# Patient Record
Sex: Female | Born: 1980
Health system: Southern US, Community
[De-identification: ages and names within clinical notes are randomized; demographics above are authoritative.]

## PROBLEM LIST (undated history)

## (undated) ENCOUNTER — Inpatient Hospital Stay (HOSPITAL_COMMUNITY): Payer: Self-pay

## (undated) DIAGNOSIS — D219 Benign neoplasm of connective and other soft tissue, unspecified: Secondary | ICD-10-CM

## (undated) DIAGNOSIS — F418 Other specified anxiety disorders: Secondary | ICD-10-CM

## (undated) DIAGNOSIS — O09529 Supervision of elderly multigravida, unspecified trimester: Secondary | ICD-10-CM

## (undated) DIAGNOSIS — Z20828 Contact with and (suspected) exposure to other viral communicable diseases: Secondary | ICD-10-CM

## (undated) DIAGNOSIS — R002 Palpitations: Secondary | ICD-10-CM

## (undated) DIAGNOSIS — M791 Myalgia, unspecified site: Secondary | ICD-10-CM

## (undated) DIAGNOSIS — I951 Orthostatic hypotension: Secondary | ICD-10-CM

## (undated) DIAGNOSIS — R000 Tachycardia, unspecified: Secondary | ICD-10-CM

## (undated) DIAGNOSIS — G253 Myoclonus: Secondary | ICD-10-CM

## (undated) DIAGNOSIS — R202 Paresthesia of skin: Secondary | ICD-10-CM

## (undated) DIAGNOSIS — I498 Other specified cardiac arrhythmias: Secondary | ICD-10-CM

## (undated) DIAGNOSIS — R200 Anesthesia of skin: Secondary | ICD-10-CM

## (undated) DIAGNOSIS — N809 Endometriosis, unspecified: Secondary | ICD-10-CM

## (undated) DIAGNOSIS — R05 Cough: Secondary | ICD-10-CM

## (undated) DIAGNOSIS — N393 Stress incontinence (female) (male): Secondary | ICD-10-CM

## (undated) DIAGNOSIS — M1990 Unspecified osteoarthritis, unspecified site: Secondary | ICD-10-CM

## (undated) DIAGNOSIS — R1310 Dysphagia, unspecified: Secondary | ICD-10-CM

## (undated) DIAGNOSIS — G90A Postural orthostatic tachycardia syndrome (POTS): Secondary | ICD-10-CM

## (undated) DIAGNOSIS — R252 Cramp and spasm: Secondary | ICD-10-CM

## (undated) DIAGNOSIS — IMO0001 Reserved for inherently not codable concepts without codable children: Secondary | ICD-10-CM

## (undated) HISTORY — DX: Anesthesia of skin: R20.0

## (undated) HISTORY — DX: Supervision of elderly multigravida, unspecified trimester: O09.529

## (undated) HISTORY — DX: Palpitations: R00.2

## (undated) HISTORY — DX: Cough: R05

## (undated) HISTORY — DX: Other specified anxiety disorders: F41.8

## (undated) HISTORY — DX: Benign neoplasm of connective and other soft tissue, unspecified: D21.9

## (undated) HISTORY — DX: Contact with and (suspected) exposure to other viral communicable diseases: Z20.828

## (undated) HISTORY — DX: Myalgia, unspecified site: M79.10

## (undated) HISTORY — DX: Paresthesia of skin: R20.2

## (undated) HISTORY — DX: Unspecified osteoarthritis, unspecified site: M19.90

## (undated) HISTORY — DX: Myoclonus: G25.3

## (undated) HISTORY — DX: Dysphagia, unspecified: R13.10

## (undated) HISTORY — DX: Stress incontinence (female) (male): N39.3

## (undated) HISTORY — DX: Endometriosis, unspecified: N80.9

## (undated) HISTORY — PX: LAPAROSCOPY ABDOMEN DIAGNOSTIC: PRO50

## (undated) HISTORY — DX: Cramp and spasm: R25.2

---

## 2014-02-08 HISTORY — PX: ABDOMINAL EXPLORATION SURGERY: SHX538

## 2014-08-07 ENCOUNTER — Other Ambulatory Visit: Payer: Self-pay | Admitting: Physician Assistant

## 2014-08-07 ENCOUNTER — Ambulatory Visit
Admission: RE | Admit: 2014-08-07 | Discharge: 2014-08-07 | Disposition: A | Discharge status: Home / Self Care | Payer: 59 | Source: Ambulatory Visit | Attending: Physician Assistant | Admitting: Physician Assistant

## 2014-08-07 DIAGNOSIS — R0602 Shortness of breath: Secondary | ICD-10-CM

## 2014-08-13 ENCOUNTER — Other Ambulatory Visit: Payer: Self-pay | Admitting: *Deleted

## 2014-08-13 DIAGNOSIS — R002 Palpitations: Secondary | ICD-10-CM

## 2014-08-13 NOTE — Progress Notes (Signed)
Received referral for event monitor from [facility] at [place], [place]  Fax # [phone]

## 2014-08-19 ENCOUNTER — Encounter (INDEPENDENT_AMBULATORY_CARE_PROVIDER_SITE_OTHER): Payer: 59

## 2014-08-19 ENCOUNTER — Encounter: Payer: Self-pay | Admitting: *Deleted

## 2014-08-19 DIAGNOSIS — R002 Palpitations: Secondary | ICD-10-CM

## 2014-08-19 NOTE — Progress Notes (Signed)
Patient ID: Kathy Dorsey, female   DOB: 07-01-1981, 33 y.o.   MRN: [id number] [employer] 30 day cardiac event monitor applied to patient.

## 2014-09-08 ENCOUNTER — Ambulatory Visit (INDEPENDENT_AMBULATORY_CARE_PROVIDER_SITE_OTHER): Payer: 59 | Admitting: Neurology

## 2014-09-08 ENCOUNTER — Encounter: Payer: Self-pay | Admitting: Neurology

## 2014-09-08 VITALS — BP 100/68 | HR 88 | Ht 64.0 in | Wt 152.3 lb

## 2014-09-08 DIAGNOSIS — R209 Unspecified disturbances of skin sensation: Secondary | ICD-10-CM

## 2014-09-08 DIAGNOSIS — R5383 Other fatigue: Secondary | ICD-10-CM

## 2014-09-08 DIAGNOSIS — R413 Other amnesia: Secondary | ICD-10-CM

## 2014-09-08 DIAGNOSIS — R5381 Other malaise: Secondary | ICD-10-CM

## 2014-09-08 DIAGNOSIS — G43809 Other migraine, not intractable, without status migrainosus: Secondary | ICD-10-CM

## 2014-09-08 DIAGNOSIS — R259 Unspecified abnormal involuntary movements: Secondary | ICD-10-CM

## 2014-09-08 DIAGNOSIS — R253 Fasciculation: Secondary | ICD-10-CM

## 2014-09-08 MED ORDER — NORTRIPTYLINE HCL 10 MG PO CAPS: 10.0000 mg | ORAL_CAPSULE | Freq: Every day | ORAL | Status: DC

## 2014-09-08 NOTE — Progress Notes (Signed)
[facility] Clinic Note - Initial Visit   Date: 09/08/2014  Kathy Dorsey MRN: [id number] DOB: 08-02-1981   Dear Dr. Helene Kelp:  Thank you for your kind referral of Kathy Dorsey for consultation of constellation of symptoms. Although her history is well known to you, please allow [place] to reiterate it for the purpose of our medical record. The patient was accompanied to the clinic by self.    History of Present Illness: Kathy Dorsey is a 33 y.o. right-handed Caucasian female with history of endometriosis s/p surgery presenting for evaluation of generalized muscle twitches, paresthesias, fatigue, and headches.    Starting in January 2015, she was sitting in a recliner and developed left hand and finger jerking which occurred intermittently and gradually started involving her entire body, including abdomen and chest. In March, she started dropping things, for instance, she was at a drive-thru and dropped the the cup.  She also complains of shortness of breath especially when laying flat.  She sleeps on 1-2 pillows.   Her biggest problem is generalized fatigue that started in April.  She feels tired even after doing one load of laundry. Denies double vision or ptosis.  She complains difficulty swallowing especially with liquids.    She also complains of memory problems for a number of years.  She writes feels that word-findings problems.    She has feelings of depression, but attributes this to her weakness.  She is able to take a 2-mile walk daily with her two sons, but finds it more difficult.  No muscle tenderness.   She complaints of generalized sharp stabbing pain over the hands.  She endorses headache, occurring over the right temporal region and described as a sharp pulsating pain. Duration is all day, frequency 3 times per week.  She takes exedrin migraine about 2-3 times per week.  She denies photophobia, phonophobia, nausea/vomiting.   She has been seeing Dr. Berdine Addison,  neurologist, since March who started her on mestinon 60mg  daily.  She developed cramping on the medication so stopped it.  She underwent NCS/EMG which showed bilateral CTS, MRI brain, and MRI cervical spine, and CSF testing which has been nondiagnotic, but we do not have these records to review.   She had had extenisve serology testing which has been normal.  Out-side paper records, electronic medical record, and images have been reviewed where available and summarized as:  Labs 02/2014:  CBC, CMP, TSH 4.05, ANA neg, Lyme neg  Past Medical History  Diagnosis Date  . Arthritis   . SOB (shortness of breath)   . Dysphagia     No past surgical history on file.   Medications:  No current outpatient prescriptions on file prior to visit.   No current facility-administered medications on file prior to visit.    Allergies: Allergies not on file  Family History: Family History  Problem Relation Age of Onset  . Rheum arthritis    . Diabetes Mother   . Cancer Maternal Grandmother   . Diabetes Maternal Grandmother   . Diabetes Paternal Grandfather   . Cancer Paternal Grandfather   . Epilepsy Sister   . Heart Problems Sister     Social History: History   Social History  . Marital Status: Married    Spouse Name: N/A    Number of Children: N/A  . Years of Education: N/A   Occupational History  . Not on file.   Social History Main Topics  . Smoking status: Not on file  .  Smokeless tobacco: Not on file  . Alcohol Use: Not on file  . Drug Use: Not on file  . Sexual Activity: Not on file   Other Topics Concern  . Not on file   Social History Narrative   Lives with husband and 2 sons.  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    Review of Systems:  CONSTITUTIONAL: No fevers, chills, night sweats, or weight loss.   EYES: No visual changes or eye pain ENT: No hearing changes.  No history of nose bleeds.   RESPIRATORY: No cough, wheezing +shortness of breath.   CARDIOVASCULAR:  Negative for chest pain, and palpitations.   GI: +for abdominal discomfort, blood in stools or black stools.  No recent change in bowel habits.   GU:  No history of incontinence.   MUSCLOSKELETAL: +history of joint pain or swelling.  +myalgias.   SKIN: Negative for lesions, rash, and itching.   HEMATOLOGY/ONCOLOGY: Negative for prolonged bleeding, bruising easily, and swollen nodes.  No history of cancer.   ENDOCRINE: Negative for cold or heat intolerance, polydipsia or goiter.   PSYCH:  +depression or anxiety symptoms.   NEURO: As Above.   Vital Signs:  BP 100/68  Pulse 88  Ht 5\' 4"  (1.626 m)  Wt 152 lb 5 oz (69.088 kg)  BMI 26.13 kg/m2  SpO2 98%   General Medical Exam:   General:  Well appearing, comfortable.   Eyes/ENT: see cranial nerve examination.   Neck: No masses appreciated.  Full range of motion without tenderness.  No carotid bruits. Respiratory:  Clear to auscultation, good air entry bilaterally.   Cardiac:  Regular rate and rhythm, no murmur.   Extremities:  No deformities, edema, or skin discoloration. Good capillary refill.   Skin:  Skin color, texture, turgor normal. No rashes or lesions.  Neurological Exam: Montreal Cognitive Assessment  09/08/2014  Visuospatial/ Executive (0/5) 5  Naming (0/3) 3  Attention: Read list of digits (0/2) 2  Attention: Read list of letters (0/1) 1  Attention: Serial 7 subtraction starting at 100 (0/3) 3  Language: Repeat phrase (0/2) 2  Language : Fluency (0/1) 1  Abstraction (0/2) 2  Delayed Recall (0/5) 3  Orientation (0/6) 6  Total 28  Adjusted Score (based on education) 28    MENTAL STATUS including orientation to time, place, person, recent and remote memory, attention span and concentration, language, and fund of knowledge is normal.  Speech is not dysarthric.  CRANIAL NERVES: II:  No visual field defects.  Unremarkable fundi.   III-IV-VI: Pupils equal round and reactive to light.  Normal conjugate, extra-ocular eye  movements in all directions of gaze.  No nystagmus.  No ptosis.   V:  Normal facial sensation.  Jaw jerk is absent.   VII:  Normal facial symmetry and movements.  No pathologic facial reflexes.  VIII:  Normal hearing and vestibular function.   IX-X:  Normal palatal movement.   XI:  Normal shoulder shrug and head rotation.   XII:  Normal tongue strength and range of motion, no deviation or fasciculation.  MOTOR:  No atrophy, fasciculations or abnormal movements.  No pronator drift.  Tone is normal.    Right Upper Extremity:    Left Upper Extremity:    Deltoid  5/5   Deltoid  5/5   Biceps  5/5   Biceps  5/5   Triceps  5/5   Triceps  5/5   Wrist extensors  5/5   Wrist extensors  5/5  Wrist flexors  5/5   Wrist flexors  5/5   Finger extensors  5/5   Finger extensors  5/5   Finger flexors  5/5   Finger flexors  5/5   Dorsal interossei  5/5   Dorsal interossei  5/5   Abductor pollicis  5/5   Abductor pollicis  5/5   Tone (Ashworth scale)  0  Tone (Ashworth scale)  0   Right Lower Extremity:    Left Lower Extremity:    Hip flexors  5/5   Hip flexors  5/5   Hip extensors  5/5   Hip extensors  5/5   Knee flexors  5/5   Knee flexors  5/5   Knee extensors  5/5   Knee extensors  5/5   Dorsiflexors  5/5   Dorsiflexors  5/5   Plantarflexors  5/5   Plantarflexors  5/5   Toe extensors  5/5   Toe extensors  5/5   Toe flexors  5/5   Toe flexors  5/5   Tone (Ashworth scale)  0  Tone (Ashworth scale)  0   MSRs:  Right                                                                 Left brachioradialis 3+  brachioradialis 3+  biceps 3+  biceps 3+  triceps 3+  triceps 3+  patellar 3+  patellar 3+  ankle jerk 2+  ankle jerk 2+  Hoffman no  Hoffman no  plantar response down  plantar response down  No crossed adductors.  0-1 beat ankle clonus bilaterally.  SENSORY:  Normal and symmetric perception of light touch, pinprick, vibration, and proprioception.  Romberg's sign absent.    COORDINATION/GAIT: Normal finger-to- nose-finger and heel-to-shin.  Intact rapid alternating movements bilaterally.  Able to rise from a chair without using arms.  Gait narrow based and stable. Tandem and stressed gait intact.    IMPRESSION: Mrs. Leamy is a 33 year-old female presenting for second opinion regarding a constellation of symptoms including paresthesias, muscle twitches, generalized pain, memory changes, shortness of breath, and dysphagia.  Her neurological exam is notable for brisk and symmetric reflexes throughout, which may be normal for patient since there is no associated upper motor neuron findings.  Additionally, she had had extensive work-up including MRI brain, MRI cervical spine, CSF testing, and EMG which has been nondiagnostic.  At some point, myasthenia has been questioned based on mildly positive tensilon test but there was no improvement with trial of mestinon.   I had a lengthy discussion that with normal testing to-date, a worrisome neurodegenerative condition is unlikely, including multiple sclerosis.  I have requested that her previous records are forwarded to my office so I can personally review the labs that she has been tested for.  In the meantime, I explained that migraine variant can present with diverse manifestations and I can offer treatment for possible migraine equivalent syndrome, which she agrees with.     PLAN/RECOMMENDATIONS:  1.  Records requested from previous neurologist's office to determine if any additional labs are needed 2.  Start nortriptyline 10mg  at bedtime for two weeks, if no improvement, increase to 2 tablets bedtime.  3.  Return to clinic in 2 months.   The duration of  this appointment visit was 60 minutes of face-to-face time with the patient.  Greater than 50% of this time was spent in counseling, explanation of diagnosis, planning of further management, and coordination of care.   Thank you for allowing me to participate in  patient's care.  If I can answer any additional questions, I would be pleased to do so.    Sincerely,    Bonner Larue K. Posey Pronto, DO

## 2014-09-09 NOTE — Progress Notes (Signed)
Note faxed.

## 2014-09-14 NOTE — Progress Notes (Signed)
Addendum  Records received from [facility]  EMG 03/05/2014: Tibial and peroneal motor responses are normal. Bilateral sural responses are normal. EMG 04/02/2014: Right greater than left carpal tunnel syndrome. No evidence of cervical radiculopathy. MRI brain with and without contrast 4/for/20/15: Normal pre-and postcontrast MRI of the brain. MRI cervical spine without contrast 04/07/2014: 1. posterior disc osteophyte complexes causing mild to moderate narrowing of the spinal canal at C5-C6 and mild narrowing at C6-7.  No abnormal cord signal. 2. Mild narrowing of bilateral neural foramen at C5-C6  Labs 03/06/2014: SPEP/UPEP with IFE no M protein, ESR 7, TSH 2.1, T4-9 0.1, T3 31.5, vitamin B12 555, folate greater than 20, hemoglobin A1c 5.7, ANA negative, copper 80, ENA negative, zinc 61, vitamin E 8.6, vitamin B6 15, Lyme negative, acetylcholine receptor binding, blocking, modulating negative, CK 55  CSF 04/17/2014:  G58 P31  Neg/normal: EBV, VDRL, CMV, HSV Cytology negative for malignant cells, no oligoclonal bands, myelin basic protein <2.0, IgG 2.5  Extensive workup has been reviewed.  No additional testing necessary at this time, will continue to follow clinically.  Jaslin Novitski K. Posey Pronto, DO

## 2014-09-15 NOTE — Progress Notes (Signed)
Patient notified

## 2014-10-12 ENCOUNTER — Other Ambulatory Visit (HOSPITAL_COMMUNITY): Payer: Self-pay | Admitting: Physician Assistant

## 2014-10-12 ENCOUNTER — Ambulatory Visit (HOSPITAL_COMMUNITY): Payer: 59 | Attending: Cardiovascular Disease | Admitting: Radiology

## 2014-10-12 DIAGNOSIS — R0602 Shortness of breath: Secondary | ICD-10-CM

## 2014-10-12 DIAGNOSIS — R002 Palpitations: Secondary | ICD-10-CM

## 2014-10-12 NOTE — Progress Notes (Signed)
Echocardiogram performed.  

## 2014-11-09 ENCOUNTER — Ambulatory Visit: Payer: 59 | Admitting: Neurology

## 2014-11-10 ENCOUNTER — Encounter: Payer: Self-pay | Admitting: *Deleted

## 2014-11-10 ENCOUNTER — Telehealth: Payer: Self-pay | Admitting: Neurology

## 2014-11-10 NOTE — Progress Notes (Signed)
No show letter sent for 11/09/2014

## 2014-11-10 NOTE — Telephone Encounter (Signed)
Pt no showed 11/09/14 follow up appt w/ Dr. Posey Pronto.  Danae Chen - please send no show letter / Gayleen [place]rem.

## 2015-02-05 DIAGNOSIS — I498 Other specified cardiac arrhythmias: Secondary | ICD-10-CM | POA: Insufficient documentation

## 2015-02-05 DIAGNOSIS — G90A Postural orthostatic tachycardia syndrome (POTS): Secondary | ICD-10-CM | POA: Insufficient documentation

## 2015-02-05 DIAGNOSIS — I951 Orthostatic hypotension: Secondary | ICD-10-CM

## 2015-02-05 DIAGNOSIS — R000 Tachycardia, unspecified: Secondary | ICD-10-CM

## 2015-04-04 ENCOUNTER — Inpatient Hospital Stay (HOSPITAL_COMMUNITY)
Admission: AD | Admit: 2015-04-04 | Payer: Commercial Managed Care - PPO | Source: Ambulatory Visit | Admitting: Obstetrics and Gynecology

## 2015-04-15 DIAGNOSIS — R200 Anesthesia of skin: Secondary | ICD-10-CM

## 2015-04-15 DIAGNOSIS — R202 Paresthesia of skin: Secondary | ICD-10-CM

## 2015-04-15 DIAGNOSIS — R252 Cramp and spasm: Secondary | ICD-10-CM

## 2015-04-15 DIAGNOSIS — M791 Myalgia, unspecified site: Secondary | ICD-10-CM

## 2015-04-15 HISTORY — DX: Myalgia, unspecified site: M79.10

## 2015-04-15 HISTORY — DX: Anesthesia of skin: R20.0

## 2015-04-15 HISTORY — DX: Paresthesia of skin: R20.2

## 2015-04-15 HISTORY — DX: Cramp and spasm: R25.2

## 2015-08-17 ENCOUNTER — Inpatient Hospital Stay (HOSPITAL_COMMUNITY)
Admission: AD | Admit: 2015-08-17 | Discharge: 2015-08-17 | Disposition: A | Discharge status: Home / Self Care | Payer: Commercial Managed Care - PPO | Source: Ambulatory Visit | Attending: Obstetrics and Gynecology | Admitting: Obstetrics and Gynecology

## 2015-08-17 ENCOUNTER — Inpatient Hospital Stay (HOSPITAL_COMMUNITY): Payer: Commercial Managed Care - PPO

## 2015-08-17 ENCOUNTER — Encounter (HOSPITAL_COMMUNITY): Payer: Self-pay | Admitting: *Deleted

## 2015-08-17 DIAGNOSIS — O209 Hemorrhage in early pregnancy, unspecified: Secondary | ICD-10-CM | POA: Insufficient documentation

## 2015-08-17 DIAGNOSIS — O468X1 Other antepartum hemorrhage, first trimester: Secondary | ICD-10-CM | POA: Diagnosis not present

## 2015-08-17 DIAGNOSIS — Z3A01 Less than 8 weeks gestation of pregnancy: Secondary | ICD-10-CM | POA: Diagnosis not present

## 2015-08-17 DIAGNOSIS — O418X10 Other specified disorders of amniotic fluid and membranes, first trimester, not applicable or unspecified: Secondary | ICD-10-CM

## 2015-08-17 LAB — URINALYSIS, ROUTINE W REFLEX MICROSCOPIC
Bilirubin Urine: NEGATIVE
Glucose, UA: NEGATIVE mg/dL
KETONES UR: NEGATIVE mg/dL
NITRITE: NEGATIVE
PROTEIN: NEGATIVE mg/dL
Specific Gravity, Urine: 1.01 (ref 1.005–1.030)
UROBILINOGEN UA: 0.2 mg/dL (ref 0.0–1.0)
pH: 5.5 (ref 5.0–8.0)

## 2015-08-17 LAB — WET PREP, GENITAL
CLUE CELLS WET PREP: NONE SEEN
Trich, Wet Prep: NONE SEEN
YEAST WET PREP: NONE SEEN

## 2015-08-17 LAB — ABO/RH: ABO/RH(D): O POS

## 2015-08-17 LAB — CBC
HCT: 36.5 % (ref 36.0–46.0)
HEMOGLOBIN: 12.4 g/dL (ref 12.0–15.0)
MCH: 29.5 pg (ref 26.0–34.0)
MCHC: 34 g/dL (ref 30.0–36.0)
MCV: 86.7 fL (ref 78.0–100.0)
PLATELETS: 250 10*3/uL (ref 150–400)
RBC: 4.21 MIL/uL (ref 3.87–5.11)
RDW: 14 % (ref 11.5–15.5)
WBC: 9.2 10*3/uL (ref 4.0–10.5)

## 2015-08-17 LAB — URINE MICROSCOPIC-ADD ON

## 2015-08-17 LAB — HCG, QUANTITATIVE, PREGNANCY: hCG, Beta Chain, Quant, S: 46850 m[IU]/mL — ABNORMAL HIGH (ref <5)

## 2015-08-17 NOTE — MAU Note (Signed)
Pt C/O spotting since yesterday, has continued to have light pink/brown spotting today, also lower abd cramping & back pain.

## 2015-08-17 NOTE — MAU Provider Note (Signed)
Chief Complaint: Vaginal Bleeding; Abdominal Pain; and Back Pain   First Provider Initiated Contact with Patient 08/17/15 1603     SUBJECTIVE HPI: Kathy Dorsey is a 34 y.o. [id number] at [redacted]w[redacted]d by LMP who presents [facility] reporting spotting and low abd/low back cramping since yesterday.   Location: mid low abd, low back Quality: cramping Severity: 5/10 on pain scale Duration: 24 hour Context: none Timing: constant Modifying factors: None. Hasn't tried anything to Tx pain.  Associated signs and symptoms: Neg for fever, chills, urinary complaints, GI, complaints, passage of clots or tissue.  Has NOB appt scheduled at [facility].   Past Medical History  Diagnosis Date  . Arthritis   . SOB (shortness of breath)   . Dysphagia    OB History  Gravida Para Term Preterm AB SAB TAB Ectopic Multiple Living  5 2 2  2 2   0 0 2    # Outcome Date GA Lbr Len/2nd Weight Sex Delivery Anes PTL Lv  5 Current           4 Term 07/08/07    [address] EPI  Y  3 Term 12/09/04    M Vag-Spont EPI  Y  2 SAB           1 SAB              Past Surgical History  Procedure Laterality Date  . Abdominal exploration surgery     Social History   Social History  . Marital Status: Married    Spouse Name: N/A  . Number of Children: N/A  . Years of Education: N/A   Occupational History  . Not on file.   Social History Main Topics  . Smoking status: Never Smoker   . Smokeless tobacco: Never Used  . Alcohol Use: No  . Drug Use: No  . Sexual Activity: Yes   Other Topics Concern  . Not on file   Social History Narrative   Lives with husband and 2 sons.  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   No current facility-administered medications on file prior to encounter.   No current outpatient prescriptions on file prior to encounter.   Allergies  Allergen Reactions  . Prednisolone Palpitations and Other (See Comments)    Joint pain    I have reviewed the past Medical Hx,  Surgical Hx, Social Hx, Allergies and Medications.   Review of Systems  Constitutional: Negative for fever and chills.  Gastrointestinal: Positive for abdominal pain. Negative for nausea, vomiting, diarrhea and constipation.  Genitourinary: Positive for vaginal bleeding. Negative for dysuria, urgency, frequency, hematuria, flank pain and vaginal discharge.  Musculoskeletal: Negative for back pain.  Neurological: Negative for dizziness.    OBJECTIVE Patient Vitals for the past 24 hrs:  BP Temp Temp src Pulse Resp SpO2 Height Weight  08/17/15 1536 - 98.2 F (36.8 C) - - - - - -  08/17/15 1530 114/63 mmHg - - 93 - - - -  08/17/15 1251 120/80 mmHg 98.6 F (37 C) Oral 85 18 100 % 5\' 4"  (1.626 m) 158 lb (71.668 kg)   Constitutional: Well-developed, well-nourished female in no acute distress.  Cardiovascular: normal rate Respiratory: normal rate and effort.  GI: Abd soft, non-tender, fundus non-palpable Pos BS x 4 MS: Extremities nontender, no edema, normal ROM Neurologic: Alert and oriented x 4.  GU: Neg CVAT.  SPECULUM EXAM: NEFG, scant dark red blood noted, cervix clean  BIMANUAL: cervix closed; uterus slightly  enlarged, no adnexal tenderness or masses. No CMT.  LAB RESULTS Results for orders placed or performed during the hospital encounter of 08/17/15 (from the past 24 hour(s))  Urinalysis, Routine w reflex microscopic (not at [facility])     Status: Abnormal   Collection Time: 08/17/15 12:55 PM  Result Value Ref Range   Color, Urine YELLOW YELLOW   APPearance CLEAR CLEAR   Specific Gravity, Urine 1.010 1.005 - 1.030   pH 5.5 5.0 - 8.0   Glucose, UA NEGATIVE NEGATIVE mg/dL   Hgb urine dipstick SMALL (A) NEGATIVE   Bilirubin Urine NEGATIVE NEGATIVE   Ketones, ur NEGATIVE NEGATIVE mg/dL   Protein, ur NEGATIVE NEGATIVE mg/dL   Urobilinogen, UA 0.2 0.0 - 1.0 mg/dL   Nitrite NEGATIVE NEGATIVE   Leukocytes, UA SMALL (A) NEGATIVE  Urine microscopic-add on     Status: Abnormal    Collection Time: 08/17/15 12:55 PM  Result Value Ref Range   Squamous Epithelial / LPF FEW (A) RARE   WBC, UA 3-6 <3 WBC/hpf   RBC / HPF 0-2 <3 RBC/hpf  CBC     Status: None   Collection Time: 08/17/15  5:16 PM  Result Value Ref Range   WBC 9.2 4.0 - 10.5 K/uL   RBC 4.21 3.87 - 5.11 MIL/uL   Hemoglobin 12.4 12.0 - 15.0 g/dL   HCT 36.5 36.0 - 46.0 %   MCV 86.7 78.0 - 100.0 fL   MCH 29.5 26.0 - 34.0 pg   MCHC 34.0 30.0 - 36.0 g/dL   RDW 14.0 11.5 - 15.5 %   Platelets 250 150 - 400 K/uL  ABO/Rh     Status: None (Preliminary result)   Collection Time: 08/17/15  5:16 PM  Result Value Ref Range   ABO/RH(D) O POS   Wet prep, genital     Status: Abnormal   Collection Time: 08/17/15  6:00 PM  Result Value Ref Range   Yeast Wet Prep HPF POC NONE SEEN NONE SEEN   Trich, Wet Prep NONE SEEN NONE SEEN   Clue Cells Wet Prep HPF POC NONE SEEN NONE SEEN   WBC, Wet Prep HPF POC MODERATE (A) NONE SEEN   Quant: 46,850  IMAGING US Ob Comp Less 14 Wks  08/17/2015   CLINICAL DATA:  Vaginal bleeding  EXAM: OBSTETRIC <14 WK [place] AND TRANSVAGINAL OB US  TECHNIQUE: Both transabdominal and transvaginal ultrasound examinations were performed for complete evaluation of the gestation as well as the maternal uterus, adnexal regions, and pelvic cul-de-sac. Transvaginal technique was performed to assess early pregnancy.  COMPARISON:  None.  FINDINGS: Intrauterine gestational sac: Visualized/normal in shape.  Yolk sac:  Present  Embryo:  Present  Cardiac Activity: Present  Heart Rate: 133  bpm  CRL:  7.6  mm   6 w   5 d                  [place] EDC: 04/06/2016  Maternal uterus/adnexae: Small subchorionic hemorrhage is noted. The ovaries appear within normal limits. The uterus is otherwise unremarkable.  IMPRESSION: Single live intrauterine gestation at 6 weeks 5 days. A small subchorionic hemorrhage is noted. Followup examination can be performed as clinically indicated.   Electronically Signed   By: Inez Catalina M.D.   On:  08/17/2015 17:09   US Ob Transvaginal  08/17/2015   CLINICAL DATA:  Vaginal bleeding  EXAM: OBSTETRIC <14 WK [place] AND TRANSVAGINAL OB US  TECHNIQUE: Both transabdominal and transvaginal ultrasound examinations were performed for  complete evaluation of the gestation as well as the maternal uterus, adnexal regions, and pelvic cul-de-sac. Transvaginal technique was performed to assess early pregnancy.  COMPARISON:  None.  FINDINGS: Intrauterine gestational sac: Visualized/normal in shape.  Yolk sac:  Present  Embryo:  Present  Cardiac Activity: Present  Heart Rate: 133  bpm  CRL:  7.6  mm   6 w   5 d                  [place] EDC: 04/06/2016  Maternal uterus/adnexae: Small subchorionic hemorrhage is noted. The ovaries appear within normal limits. The uterus is otherwise unremarkable.  IMPRESSION: Single live intrauterine gestation at 6 weeks 5 days. A small subchorionic hemorrhage is noted. Followup examination can be performed as clinically indicated.   Electronically Signed   By: Inez Catalina M.D.   On: 08/17/2015 17:09    MAU COURSE UA, UPT, CBC, Quant, Wet prep, GC/Chlamydia, [place]  MDM 34 year-old pregnant female presents w/ spotting and cramping evaluated for ectopic, SAB, infection. Live, SIUP w/ small Keeler seen on [place] today. No evidence of emergent condition.  ASSESSMENT 1. Subchorionic hemorrhage, first trimester   2. Vaginal bleeding in pregnancy, first trimester     PLAN Discharge home in stable condition. First trimester Precautions. Pelvic rest x 1 week. GC/Chlamydia, urine cultures pending.  Follow-up Information    Follow up with Logan Bores, MD On 09/03/2015.   Specialty:  Obstetrics and Gynecology   Why:  Routine prenatal visit or sooner As needed if symptoms worsen   Contact information:   [address] [address] [phone]       Follow up with [facility].   Why:  As needed in emergencies   Contact  information:   [address] [id number] [place] [phone]       Medication List    TAKE these medications        PRENATAL DHA PO  Take 1 tablet by mouth daily.       [place], CNM 08/17/2015  6:18 PM

## 2015-08-17 NOTE — Discharge Instructions (Signed)
Subchorionic Hematoma °A subchorionic hematoma is a gathering of blood between the outer wall of the placenta and the inner wall of the womb (uterus). The placenta is the organ that connects the fetus to the wall of the uterus. The placenta performs the feeding, breathing (oxygen to the fetus), and waste removal (excretory work) of the fetus.  °Subchorionic hematoma is the most common abnormality found on a result from ultrasonography done during the first trimester or early second trimester of pregnancy. If there has been little or no vaginal bleeding, early small hematomas usually shrink on their own and do not affect your baby or pregnancy. The blood is gradually absorbed over 1-2 weeks. When bleeding starts later in pregnancy or the hematoma is larger or occurs in an older pregnant woman, the outcome may not be as good. Larger hematomas may get bigger, which increases the chances for miscarriage. Subchorionic hematoma also increases the risk of premature detachment of the placenta from the uterus, preterm (premature) labor, and stillbirth. °HOME CARE INSTRUCTIONS °· Stay on bed rest if your health care provider recommends this. Although bed rest will not prevent more bleeding or prevent a miscarriage, your health care provider may recommend bed rest until you are advised otherwise. °· Avoid heavy lifting (more than 10 lb [4.5 kg]), exercise, sexual intercourse, or douching as directed by your health care provider. °· Keep track of the number of pads you use each day and how soaked (saturated) they are. Write down this information. °· Do not use tampons. °· Keep all follow-up appointments as directed by your health care provider. Your health care provider may ask you to have follow-up blood tests or ultrasound tests or both. °SEEK IMMEDIATE MEDICAL CARE IF: °· You have severe cramps in your stomach, back, abdomen, or pelvis. °· You have a fever. °· You pass large clots or tissue. Save any tissue for your health  care provider to look at. °· Your bleeding increases or you become lightheaded, feel weak, or have fainting episodes. °Document Released: 03/14/2007 Document Revised: 04/13/2014 Document Reviewed: 06/26/2013 °ExitCare® Patient Information ©2015 [employer]. This information is not intended to replace advice given to you by your health care provider. Make sure you discuss any questions you have with your health care provider. ° °Pelvic Rest °Pelvic rest is sometimes recommended for women when:  °· The placenta is partially or completely covering the opening of the cervix (placenta previa). °· There is bleeding between the uterine wall and the amniotic sac in the first trimester (subchorionic hemorrhage). °· The cervix begins to open without labor starting (incompetent cervix, cervical insufficiency). °· The labor is too early (preterm labor). °HOME CARE INSTRUCTIONS °· Do not have sexual intercourse, stimulation, or an orgasm. °· Do not use tampons, douche, or put anything in the vagina. °· Do not lift anything over 10 pounds (4.5 kg). °· Avoid strenuous activity or straining your pelvic muscles. °SEEK MEDICAL CARE IF:  °· You have any vaginal bleeding during pregnancy. Treat this as a potential emergency. °· You have cramping pain felt low in the stomach (stronger than menstrual cramps). °· You notice vaginal discharge (watery, mucus, or bloody). °· You have a low, dull backache. °· There are regular contractions or uterine tightening. °SEEK IMMEDIATE MEDICAL CARE IF: °You have vaginal bleeding and have placenta previa.  °Document Released: 03/24/2011 Document Revised: 02/19/2012 Document Reviewed: 03/24/2011 °ExitCare® Patient Information ©2015 [employer]. This information is not intended to replace advice given to you by your health care provider.   provider. Make sure you discuss any questions you have with your health care provider.

## 2015-08-18 LAB — CULTURE, OB URINE: Special Requests: NORMAL

## 2015-08-18 LAB — GC/CHLAMYDIA PROBE AMP (~~LOC~~) NOT AT ARMC
Chlamydia: NEGATIVE
NEISSERIA GONORRHEA: NEGATIVE

## 2015-08-18 LAB — HIV ANTIBODY (ROUTINE TESTING W REFLEX): HIV SCREEN 4TH GENERATION: NONREACTIVE

## 2015-09-03 LAB — OB RESULTS CONSOLE ABO/RH: RH Type: POSITIVE

## 2015-09-03 LAB — OB RESULTS CONSOLE HEPATITIS B SURFACE ANTIGEN: HEP B S AG: NEGATIVE

## 2015-09-03 LAB — OB RESULTS CONSOLE RUBELLA ANTIBODY, IGM: Rubella: IMMUNE

## 2015-09-03 LAB — OB RESULTS CONSOLE RPR: RPR: NONREACTIVE

## 2015-09-03 LAB — OB RESULTS CONSOLE GC/CHLAMYDIA: Gonorrhea: NEGATIVE

## 2015-12-12 NOTE — L&D Delivery Note (Signed)
Delivery Note At 3:14 PM a viable and healthy female was delivered via Vaginal, Spontaneous Delivery (Presentation: Right Occiput Anterior).  APGAR: 7, 8; weight 6 lb 15.1 oz (3150 g).   Placenta status: Intact, Spontaneous.  Cord: 3 vessels with the following complications: None.   Anesthesia: Epidural  Episiotomy: None Lacerations: 1st degree;Labial;Vaginal - hemostatic Suture Repair: N/A Est. Blood Loss (mL): 150cc  Mom to postpartum.  Baby to Couplet care / Skin to Skin.  Bovard-Stuckert, Kathy Dorsey 03/05/2016, 4:54 PM  O+/Br/RI/no Tdap in PNC/Contra?  D/W pt and FOB circumcision for female infant inc r/b/a wishes to proceed.

## 2016-01-20 ENCOUNTER — Inpatient Hospital Stay (HOSPITAL_COMMUNITY)
Admission: AD | Admit: 2016-01-20 | Discharge: 2016-01-21 | Disposition: A | Discharge status: Home / Self Care | Payer: Commercial Managed Care - PPO | Source: Ambulatory Visit | Attending: Obstetrics and Gynecology | Admitting: Obstetrics and Gynecology

## 2016-01-20 ENCOUNTER — Encounter (HOSPITAL_COMMUNITY): Payer: Self-pay | Admitting: *Deleted

## 2016-01-20 DIAGNOSIS — O4700 False labor before 37 completed weeks of gestation, unspecified trimester: Secondary | ICD-10-CM | POA: Insufficient documentation

## 2016-01-20 DIAGNOSIS — Z820 Family history of epilepsy and other diseases of the nervous system: Secondary | ICD-10-CM | POA: Diagnosis not present

## 2016-01-20 DIAGNOSIS — Z809 Family history of malignant neoplasm, unspecified: Secondary | ICD-10-CM | POA: Diagnosis not present

## 2016-01-20 DIAGNOSIS — Z3A29 29 weeks gestation of pregnancy: Secondary | ICD-10-CM | POA: Diagnosis present

## 2016-01-20 DIAGNOSIS — Z833 Family history of diabetes mellitus: Secondary | ICD-10-CM | POA: Diagnosis not present

## 2016-01-20 DIAGNOSIS — O4703 False labor before 37 completed weeks of gestation, third trimester: Secondary | ICD-10-CM

## 2016-01-20 LAB — URINALYSIS, ROUTINE W REFLEX MICROSCOPIC
BILIRUBIN URINE: NEGATIVE
Glucose, UA: NEGATIVE mg/dL
Hgb urine dipstick: NEGATIVE
KETONES UR: 40 mg/dL — AB
NITRITE: NEGATIVE
PROTEIN: NEGATIVE mg/dL
SPECIFIC GRAVITY, URINE: 1.01 (ref 1.005–1.030)
pH: 6 (ref 5.0–8.0)

## 2016-01-20 LAB — WET PREP, GENITAL
Clue Cells Wet Prep HPF POC: NONE SEEN
SPERM: NONE SEEN
TRICH WET PREP: NONE SEEN
YEAST WET PREP: NONE SEEN

## 2016-01-20 LAB — URINE MICROSCOPIC-ADD ON

## 2016-01-20 MED ORDER — NIFEDIPINE 10 MG PO CAPS
10.0000 mg | ORAL_CAPSULE | ORAL | Status: AC
  Administered 2016-01-20 – 2016-01-21 (×3): 10 mg via ORAL
  Filled 2016-01-20: qty 1

## 2016-01-20 NOTE — MAU Provider Note (Signed)
History     CSN: [id number]  Arrival date and time: 01/20/16 2225   First Provider Initiated Contact with Patient 01/20/16 2300      Chief Complaint  Patient presents with  . Contractions    8ctx per hr for a few hours   HPI Comments: Kathy Dorsey is a 35 y.o. [id number] at [redacted]w[redacted]d who presents today with contractions. She states that the contractions started about 3 hours ago, and are about every 8-11 mins. She denies any VB or LOF. She confirms fetal movement. She states that she was seen in the office about 2 weeks ago for contractions. She had a negative FFN, and she states that her cervix was FT/thick. She has had 2 full term deliveries, and reports that she had preterm contractions with her first child. However, he was born at term. She also reports a hx of POTS, and states that she has had increased episodes of recently. She last saw cardiology about 6-8 weeks ago. She is not currently on medication at this time.   Abdominal Pain This is a new problem. The current episode started today. The onset quality is gradual. The problem occurs intermittently (about 8-11 per hour ). The problem has been unchanged. The pain is located in the suprapubic region. The pain is at a severity of 2/10. The quality of the pain is cramping. The abdominal pain does not radiate. Pertinent negatives include no constipation, diarrhea, dysuria, fever, frequency, nausea or vomiting. Nothing aggravates the pain. The pain is relieved by nothing. She has tried nothing for the symptoms.     Past Medical History  Diagnosis Date  . Arthritis   . SOB (shortness of breath)   . Dysphagia     Past Surgical History  Procedure Laterality Date  . Abdominal exploration surgery      Family History  Problem Relation Age of Onset  . Rheum arthritis    . Diabetes Mother   . Cancer Maternal Grandmother   . Diabetes Maternal Grandmother   . Diabetes Paternal Grandfather   . Cancer Paternal Grandfather   . Epilepsy Sister    . Heart Problems Sister     Social History  Substance Use Topics  . Smoking status: Never Smoker   . Smokeless tobacco: Never Used  . Alcohol Use: No    Allergies:  Allergies  Allergen Reactions  . Prednisolone Palpitations and Other (See Comments)    Joint pain    Prescriptions prior to admission  Medication Sig Dispense Refill Last Dose  . Docosahexaenoic Acid (PRENATAL DHA PO) Take 1 tablet by mouth daily.   08/16/2015 at Unknown time    Review of Systems  Constitutional: Negative for fever and chills.  Gastrointestinal: Positive for abdominal pain. Negative for nausea, vomiting, diarrhea and constipation.  Genitourinary: Negative for dysuria, urgency and frequency.  Neurological: Positive for dizziness.   Physical Exam   Blood pressure 108/67, pulse 95, height 5\' 4"  (1.626 m), weight 77.928 kg (171 lb 12.8 oz), last menstrual period 06/28/2015, SpO2 94 %.  Physical Exam  Nursing note and vitals reviewed. Constitutional: She is oriented to person, place, and time. She appears well-developed and well-nourished. No distress.  HENT:  Head: Normocephalic.  Cardiovascular: Normal rate.   Respiratory: Effort normal.  GI: Soft. There is no tenderness. There is no rebound.  Genitourinary:  FT at ext os/closed at int os/thick/-3   Neurological: She is alert and oriented to person, place, and time.  Skin: Skin is warm and dry.  Psychiatric: She has a normal mood and affect.   FHT: 135, moderate with 15x15 accels, no decels Toco: q 4-5 min contractions  MAU Course  Procedures  MDM 2338; D/W Dr. Willis Modena, patient can have terb or procardia to stop contractions. Terb is less likely to drop b/p, and may be preferred. Will offer both to the patient. If ctx stop and no cervical change then patient can be dc home.   D/W the patient at length. She would like to try procardia at this time.   0115: Patient has had 3 doses of procardia. She reports that contractions have  spaced out, and feel less intense.  FHT: 135, moderate with 15x15 accels, no decels Toco: no UCs  Cervical exam: Unchanged   Assessment and Plan   1. Threatened preterm labor, third trimester    DC home Comfort measures reviewed  3rd Trimester precautions  Fetal kick counts RX: none  Return to MAU as needed FU with OB as planned  Follow-up Information    Follow up with Clarene Duke, MD.   Specialty:  Obstetrics and Gynecology   Why:  As scheduled   Contact information:   [address], [address] [place] [address] [phone]         Mathis Bud 01/20/2016, 11:02 PM

## 2016-01-20 NOTE — MAU Note (Addendum)
Pt c/o ctx for 3 hours (8-11/ hr). Pt rates the ctx pain a 2/10 and states she is hydrated and well rested. Denies LOF and VB. +FM. Also c/o SOB and tachycardia (pt has POTS).

## 2016-01-21 DIAGNOSIS — O4703 False labor before 37 completed weeks of gestation, third trimester: Secondary | ICD-10-CM | POA: Diagnosis not present

## 2016-01-21 DIAGNOSIS — O4700 False labor before 37 completed weeks of gestation, unspecified trimester: Secondary | ICD-10-CM | POA: Diagnosis not present

## 2016-01-21 NOTE — Progress Notes (Signed)
FHT from 2-9 reviewed.  Reactive NST, irregular ctx.

## 2016-01-21 NOTE — Discharge Instructions (Signed)

## 2016-02-18 DIAGNOSIS — O09529 Supervision of elderly multigravida, unspecified trimester: Secondary | ICD-10-CM

## 2016-02-18 HISTORY — DX: Supervision of elderly multigravida, unspecified trimester: O09.529

## 2016-02-29 ENCOUNTER — Encounter (HOSPITAL_COMMUNITY): Payer: Self-pay | Admitting: *Deleted

## 2016-02-29 ENCOUNTER — Inpatient Hospital Stay (HOSPITAL_COMMUNITY)
Admission: AD | Admit: 2016-02-29 | Discharge: 2016-02-29 | Disposition: A | Discharge status: Home / Self Care | Payer: Commercial Managed Care - PPO | Source: Ambulatory Visit | Attending: Obstetrics and Gynecology | Admitting: Obstetrics and Gynecology

## 2016-02-29 DIAGNOSIS — Z833 Family history of diabetes mellitus: Secondary | ICD-10-CM | POA: Diagnosis not present

## 2016-02-29 DIAGNOSIS — O09523 Supervision of elderly multigravida, third trimester: Secondary | ICD-10-CM | POA: Insufficient documentation

## 2016-02-29 DIAGNOSIS — O26893 Other specified pregnancy related conditions, third trimester: Secondary | ICD-10-CM | POA: Diagnosis not present

## 2016-02-29 DIAGNOSIS — Z888 Allergy status to other drugs, medicaments and biological substances status: Secondary | ICD-10-CM | POA: Diagnosis not present

## 2016-02-29 DIAGNOSIS — N898 Other specified noninflammatory disorders of vagina: Secondary | ICD-10-CM

## 2016-02-29 DIAGNOSIS — Z3A35 35 weeks gestation of pregnancy: Secondary | ICD-10-CM | POA: Diagnosis not present

## 2016-02-29 DIAGNOSIS — O4703 False labor before 37 completed weeks of gestation, third trimester: Secondary | ICD-10-CM

## 2016-02-29 LAB — URINALYSIS, ROUTINE W REFLEX MICROSCOPIC
BILIRUBIN URINE: NEGATIVE
Glucose, UA: NEGATIVE mg/dL
Ketones, ur: 15 mg/dL — AB
NITRITE: NEGATIVE
PH: 6 (ref 5.0–8.0)
Protein, ur: NEGATIVE mg/dL
SPECIFIC GRAVITY, URINE: 1.025 (ref 1.005–1.030)

## 2016-02-29 LAB — URINE MICROSCOPIC-ADD ON: RBC / HPF: NONE SEEN RBC/hpf (ref 0–5)

## 2016-02-29 LAB — POCT FERN TEST: POCT FERN TEST: NEGATIVE

## 2016-02-29 LAB — AMNISURE RUPTURE OF MEMBRANE (ROM) NOT AT ARMC: AMNISURE: NEGATIVE

## 2016-02-29 NOTE — MAU Note (Signed)
Pt C/O losing her mucus plug a couple of days ago.  Had increased pink discharge last night, began having uc's around 2200.  Woke up @ 0400 with increased pain, uc's are now 5 minutes apart.  Denies bright red bleeding, had ? Gush of fluid this morning.

## 2016-02-29 NOTE — Discharge Instructions (Signed)
Braxton Hicks Contractions °Contractions of the uterus can occur throughout pregnancy. Contractions are not always a sign that you are in labor.  °WHAT ARE BRAXTON HICKS CONTRACTIONS?  °Contractions that occur before labor are called Braxton Hicks contractions, or false labor. Toward the end of pregnancy (32-34 weeks), these contractions can develop more often and may become more forceful. This is not true labor because these contractions do not result in opening (dilatation) and thinning of the cervix. They are sometimes difficult to tell apart from true labor because these contractions can be forceful and people have different pain tolerances. You should not feel embarrassed if you go to the hospital with false labor. Sometimes, the only way to tell if you are in true labor is for your health care provider to look for changes in the cervix. °If there are no prenatal problems or other health problems associated with the pregnancy, it is completely safe to be sent home with false labor and await the onset of true labor. °HOW CAN YOU TELL THE DIFFERENCE BETWEEN TRUE AND FALSE LABOR? °False Labor °· The contractions of false labor are usually shorter and not as hard as those of true labor.   °· The contractions are usually irregular.   °· The contractions are often felt in the front of the lower abdomen and in the groin.   °· The contractions may go away when you walk around or change positions while lying down.   °· The contractions get weaker and are shorter lasting as time goes on.   °· The contractions do not usually become progressively stronger, regular, and closer together as with true labor.   °True Labor °1. Contractions in true labor last 30-70 seconds, become very regular, usually become more intense, and increase in frequency.   °2. The contractions do not go away with walking.   °3. The discomfort is usually felt in the top of the uterus and spreads to the lower abdomen and low back.   °4. True labor can  be determined by your health care provider with an exam. This will show that the cervix is dilating and getting thinner.   °WHAT TO REMEMBER °· Keep up with your usual exercises and follow other instructions given by your health care provider.   °· Take medicines as directed by your health care provider.   °· Keep your regular prenatal appointments.   °· Eat and drink lightly if you think you are going into labor.   °· If Braxton Hicks contractions are making you uncomfortable:   °· Change your position from lying down or resting to walking, or from walking to resting.   °· Sit and rest in a tub of warm water.   °· Drink 2-3 glasses of water. Dehydration may cause these contractions.   °· Do slow and deep breathing several times an hour.   °WHEN SHOULD I SEEK IMMEDIATE MEDICAL CARE? °Seek immediate medical care if: °· Your contractions become stronger, more regular, and closer together.   °· You have fluid leaking or gushing from your vagina.   °· You have a fever.   °· You pass blood-tinged mucus.   °· You have vaginal bleeding.   °· You have continuous abdominal pain.   °· You have low back pain that you never had before.   °· You feel your baby's head pushing down and causing pelvic pressure.   °· Your baby is not moving as much as it used to.   °  °This information is not intended to replace advice given to you by your health care provider. Make sure you discuss any questions you have with your health care   provider. °  °Document Released: 11/27/2005 Document Revised: 12/02/2013 Document Reviewed: 09/08/2013 °Elsevier Interactive Patient Education ©2016 [employer] ° °Fetal Movement Counts °Patient Name: __________________________________________________ Patient Due Date: ____________________ °Performing a fetal movement count is highly recommended in high-risk pregnancies, but it is good for every pregnant woman to do. Your health care provider may ask you to start counting fetal movements at 28 weeks of the  pregnancy. Fetal movements often increase: °· After eating a full meal. °· After physical activity. °· After eating or drinking something sweet or cold. °· At rest. °Pay attention to when you feel the baby is most active. This will help you notice a pattern of your baby's sleep and wake cycles and what factors contribute to an increase in fetal movement. It is important to perform a fetal movement count at the same time each day when your baby is normally most active.  °HOW TO COUNT FETAL MOVEMENTS °5. Find a quiet and comfortable area to sit or lie down on your left side. Lying on your left side provides the best blood and oxygen circulation to your baby. °6. Write down the day and time on a sheet of paper or in a journal. °7. Start counting kicks, flutters, swishes, rolls, or jabs in a 2-hour period. You should feel at least 10 movements within 2 hours. °8. If you do not feel 10 movements in 2 hours, wait 2-3 hours and count again. Look for a change in the pattern or not enough counts in 2 hours. °SEEK MEDICAL CARE IF: °· You feel less than 10 counts in 2 hours, tried twice. °· There is no movement in over an hour. °· The pattern is changing or taking longer each day to reach 10 counts in 2 hours. °· You feel the baby is not moving as he or she usually does. °Date: ____________ Movements: ____________ Start time: ____________ Finish time: ____________  °Date: ____________ Movements: ____________ Start time: ____________ Finish time: ____________ °Date: ____________ Movements: ____________ Start time: ____________ Finish time: ____________ °Date: ____________ Movements: ____________ Start time: ____________ Finish time: ____________ °Date: ____________ Movements: ____________ Start time: ____________ Finish time: ____________ °Date: ____________ Movements: ____________ Start time: ____________ Finish time: ____________ °Date: ____________ Movements: ____________ Start time: ____________ Finish time:  ____________ °Date: ____________ Movements: ____________ Start time: ____________ Finish time: ____________  °Date: ____________ Movements: ____________ Start time: ____________ Finish time: ____________ °Date: ____________ Movements: ____________ Start time: ____________ Finish time: ____________ °Date: ____________ Movements: ____________ Start time: ____________ Finish time: ____________ °Date: ____________ Movements: ____________ Start time: ____________ Finish time: ____________ °Date: ____________ Movements: ____________ Start time: ____________ Finish time: ____________ °Date: ____________ Movements: ____________ Start time: ____________ Finish time: ____________ °Date: ____________ Movements: ____________ Start time: ____________ Finish time: ____________  °Date: ____________ Movements: ____________ Start time: ____________ Finish time: ____________ °Date: ____________ Movements: ____________ Start time: ____________ Finish time: ____________ °Date: ____________ Movements: ____________ Start time: ____________ Finish time: ____________ °Date: ____________ Movements: ____________ Start time: ____________ Finish time: ____________ °Date: ____________ Movements: ____________ Start time: ____________ Finish time: ____________ °Date: ____________ Movements: ____________ Start time: ____________ Finish time: ____________ °Date: ____________ Movements: ____________ Start time: ____________ Finish time: ____________  °Date: ____________ Movements: ____________ Start time: ____________ Finish time: ____________ °Date: ____________ Movements: ____________ Start time: ____________ Finish time: ____________ °Date: ____________ Movements: ____________ Start time: ____________ Finish time: ____________ °Date: ____________ Movements: ____________ Start time: ____________ Finish time: ____________ °Date: ____________ Movements: ____________ Start time: ____________ Finish time: ____________ °Date: ____________ Movements:  ____________ Start time: ____________ Finish   time: ____________ °Date: ____________ Movements: ____________ Start time: ____________ Finish time: ____________  °Date: ____________ Movements: ____________ Start time: ____________ Finish time: ____________ °Date: ____________ Movements: ____________ Start time: ____________ Finish time: ____________ °Date: ____________ Movements: ____________ Start time: ____________ Finish time: ____________ °Date: ____________ Movements: ____________ Start time: ____________ Finish time: ____________ °Date: ____________ Movements: ____________ Start time: ____________ Finish time: ____________ °Date: ____________ Movements: ____________ Start time: ____________ Finish time: ____________ °Date: ____________ Movements: ____________ Start time: ____________ Finish time: ____________  °Date: ____________ Movements: ____________ Start time: ____________ Finish time: ____________ °Date: ____________ Movements: ____________ Start time: ____________ Finish time: ____________ °Date: ____________ Movements: ____________ Start time: ____________ Finish time: ____________ °Date: ____________ Movements: ____________ Start time: ____________ Finish time: ____________ °Date: ____________ Movements: ____________ Start time: ____________ Finish time: ____________ °Date: ____________ Movements: ____________ Start time: ____________ Finish time: ____________ °Date: ____________ Movements: ____________ Start time: ____________ Finish time: ____________  °Date: ____________ Movements: ____________ Start time: ____________ Finish time: ____________ °Date: ____________ Movements: ____________ Start time: ____________ Finish time: ____________ °Date: ____________ Movements: ____________ Start time: ____________ Finish time: ____________ °Date: ____________ Movements: ____________ Start time: ____________ Finish time: ____________ °Date: ____________ Movements: ____________ Start time: ____________ Finish  time: ____________ °Date: ____________ Movements: ____________ Start time: ____________ Finish time: ____________ °Date: ____________ Movements: ____________ Start time: ____________ Finish time: ____________  °Date: ____________ Movements: ____________ Start time: ____________ Finish time: ____________ °Date: ____________ Movements: ____________ Start time: ____________ Finish time: ____________ °Date: ____________ Movements: ____________ Start time: ____________ Finish time: ____________ °Date: ____________ Movements: ____________ Start time: ____________ Finish time: ____________ °Date: ____________ Movements: ____________ Start time: ____________ Finish time: ____________ °Date: ____________ Movements: ____________ Start time: ____________ Finish time: ____________ °  °This information is not intended to replace advice given to you by your health care provider. Make sure you discuss any questions you have with your health care provider. °  °Document Released: 12/27/2006 Document Revised: 12/18/2014 Document Reviewed: 09/23/2012 °Elsevier Interactive Patient Education ©2016 [employer] ° °

## 2016-02-29 NOTE — MAU Provider Note (Signed)
History     CSN: [id number]  Arrival date and time: 02/29/16 [id number]   First Provider Initiated Contact with Patient 02/29/16 [phone]         Chief Complaint  Patient presents with  . Contractions   HPI Kathy Dorsey is a 35 y.o. [id number] at [redacted]w[redacted]d who presents for r/o SROM & contractions.  Reports leaking mucous discharge since last night. This morning saw some pink watery fluid after using the bathroom. Now just feels like mucous discharge.  Also reports contractions since last night. States they were 8 minutes apart but not timing them anymore.  Denies vaginal bleeding. Cervix was dilated 1.5 cm in the office last week. Denies complications with this pregnancy.  Positive fetal movement.   OB History    Gravida Para Term Preterm AB TAB SAB Ectopic Multiple Living   5 2 2  2  2  0 0 2      Past Medical History  Diagnosis Date  . Arthritis   . SOB (shortness of breath)   . Dysphagia     Past Surgical History  Procedure Laterality Date  . Abdominal exploration surgery      Family History  Problem Relation Age of Onset  . Rheum arthritis    . Diabetes Mother   . Cancer Maternal Grandmother   . Diabetes Maternal Grandmother   . Diabetes Paternal Grandfather   . Cancer Paternal Grandfather   . Epilepsy Sister   . Heart Problems Sister     Social History  Substance Use Topics  . Smoking status: Never Smoker   . Smokeless tobacco: Never Used  . Alcohol Use: No    Allergies:  Allergies  Allergen Reactions  . Prednisolone Palpitations and Other (See Comments)    Joint pain    Prescriptions prior to admission  Medication Sig Dispense Refill Last Dose  . Docosahexaenoic Acid (PRENATAL DHA PO) Take 1 tablet by mouth daily.   08/16/2015 at Unknown time    ROS Physical Exam   Blood pressure 106/72, pulse 112, temperature 97.7 F (36.5 C), temperature source Oral, resp. rate 20, last menstrual period 06/28/2015.  Physical Exam  Nursing note and vitals  reviewed. Constitutional: She is oriented to person, place, and time. She appears well-developed and well-nourished. No distress.  HENT:  Head: Normocephalic and atraumatic.  Eyes: Conjunctivae are normal. Right eye exhibits no discharge. Left eye exhibits no discharge. No scleral icterus.  Neck: Normal range of motion.  Respiratory: Effort normal. No respiratory distress.  Genitourinary: Vagina normal. Cervix exhibits discharge (small amount of white mucoid discharge. No pooling. No blood).  Neurological: She is alert and oriented to person, place, and time.  Skin: Skin is warm and dry. She is not diaphoretic.  Psychiatric: She has a normal mood and affect. Her behavior is normal. Judgment and thought content normal.   Dilation: 2.5 Effacement (%): 50 Cervical Position: Posterior Station: Ballotable Presentation: Vertex Exam by:: Jorje Guild NP  Fetal Tracing:  Baseline: 130 Variability: moderate Accelerations: 15x15 Decelerations: none  Toco: irregular   MAU Course  Procedures Results for orders placed or performed during the hospital encounter of 02/29/16 (from the past 24 hour(s))  Amnisure rupture of membrane (rom)not at [facility]     Status: None   Collection Time: 02/29/16  7:35 AM  Result Value Ref Range   Amnisure ROM NEGATIVE   Urinalysis, Routine w reflex microscopic (not at [facility])     Status: Abnormal   Collection Time: 02/29/16  7:56 AM  Result Value Ref Range   Color, Urine YELLOW YELLOW   APPearance CLEAR CLEAR   Specific Gravity, Urine 1.025 1.005 - 1.030   pH 6.0 5.0 - 8.0   Glucose, UA NEGATIVE NEGATIVE mg/dL   Hgb urine dipstick TRACE (A) NEGATIVE   Bilirubin Urine NEGATIVE NEGATIVE   Ketones, ur 15 (A) NEGATIVE mg/dL   Protein, ur NEGATIVE NEGATIVE mg/dL   Nitrite NEGATIVE NEGATIVE   Leukocytes, UA SMALL (A) NEGATIVE  Urine microscopic-add on     Status: Abnormal   Collection Time: 02/29/16  7:56 AM  Result Value Ref Range   Squamous Epithelial /  LPF 0-5 (A) NONE SEEN   WBC, UA 6-30 0 - 5 WBC/hpf   RBC / HPF NONE SEEN 0 - 5 RBC/hpf   Bacteria, UA RARE (A) NONE SEEN  Fern Test     Status: Normal   Collection Time: 02/29/16  9:44 AM  Result Value Ref Range   POCT Fern Test Negative = intact amniotic membranes     MDM No pooling, fern negative, amnisure negative Reactive tracing with irregular contractions SVE unchanged while in MAU [id number] S/w Dr. Terri Piedra. Ok to discharge home. Keep f/u on Friday in office.   Assessment and Plan  A: 1. Vaginal discharge in pregnancy in third trimester   2. Preterm contractions, third trimester     P: Discharge home Labor precautions & fetal kick count form given Keep appt with OB on Friday.   Jorje Guild 02/29/2016, 8:14 AM

## 2016-03-03 LAB — OB RESULTS CONSOLE GBS: STREP GROUP B AG: NEGATIVE

## 2016-03-04 ENCOUNTER — Inpatient Hospital Stay (HOSPITAL_COMMUNITY)
Admission: AD | Admit: 2016-03-04 | Discharge: 2016-03-06 | DRG: 775 | Disposition: A | Discharge status: Home / Self Care | Payer: Commercial Managed Care - PPO | Source: Ambulatory Visit | Attending: Obstetrics and Gynecology | Admitting: Obstetrics and Gynecology

## 2016-03-04 ENCOUNTER — Inpatient Hospital Stay (HOSPITAL_COMMUNITY): Payer: Commercial Managed Care - PPO | Admitting: Anesthesiology

## 2016-03-04 ENCOUNTER — Encounter (HOSPITAL_COMMUNITY): Payer: Self-pay | Admitting: Certified Nurse Midwife

## 2016-03-04 DIAGNOSIS — Z9889 Other specified postprocedural states: Secondary | ICD-10-CM

## 2016-03-04 DIAGNOSIS — Z820 Family history of epilepsy and other diseases of the nervous system: Secondary | ICD-10-CM

## 2016-03-04 DIAGNOSIS — Z833 Family history of diabetes mellitus: Secondary | ICD-10-CM

## 2016-03-04 DIAGNOSIS — IMO0001 Reserved for inherently not codable concepts without codable children: Secondary | ICD-10-CM

## 2016-03-04 DIAGNOSIS — Z809 Family history of malignant neoplasm, unspecified: Secondary | ICD-10-CM | POA: Diagnosis not present

## 2016-03-04 DIAGNOSIS — Z3A35 35 weeks gestation of pregnancy: Secondary | ICD-10-CM

## 2016-03-04 HISTORY — DX: Reserved for inherently not codable concepts without codable children: IMO0001

## 2016-03-04 HISTORY — DX: Other specified cardiac arrhythmias: I49.8

## 2016-03-04 HISTORY — DX: Postural orthostatic tachycardia syndrome (POTS): G90.A

## 2016-03-04 HISTORY — DX: Tachycardia, unspecified: R00.0

## 2016-03-04 HISTORY — DX: Orthostatic hypotension: I95.1

## 2016-03-04 LAB — URINALYSIS, ROUTINE W REFLEX MICROSCOPIC
BILIRUBIN URINE: NEGATIVE
GLUCOSE, UA: NEGATIVE mg/dL
HGB URINE DIPSTICK: NEGATIVE
Ketones, ur: NEGATIVE mg/dL
Leukocytes, UA: NEGATIVE
Nitrite: NEGATIVE
PROTEIN: NEGATIVE mg/dL
Specific Gravity, Urine: 1.01 (ref 1.005–1.030)
pH: 5.5 (ref 5.0–8.0)

## 2016-03-04 LAB — CBC
HCT: 33 % — ABNORMAL LOW (ref 36.0–46.0)
Hemoglobin: 11.1 g/dL — ABNORMAL LOW (ref 12.0–15.0)
MCH: 29.1 pg (ref 26.0–34.0)
MCHC: 33.6 g/dL (ref 30.0–36.0)
MCV: 86.4 fL (ref 78.0–100.0)
Platelets: 228 K/uL (ref 150–400)
RBC: 3.82 MIL/uL — ABNORMAL LOW (ref 3.87–5.11)
RDW: 14.6 % (ref 11.5–15.5)
WBC: 10.8 K/uL — ABNORMAL HIGH (ref 4.0–10.5)

## 2016-03-04 LAB — TYPE AND SCREEN
ABO/RH(D): O POS
Antibody Screen: NEGATIVE

## 2016-03-04 MED ORDER — OXYTOCIN 10 UNIT/ML IJ SOLN
2.5000 [IU]/h | INTRAVENOUS | Status: DC
  Administered 2016-03-05: 2.5 m[IU]/min via INTRAVENOUS

## 2016-03-04 MED ORDER — OXYCODONE-ACETAMINOPHEN 5-325 MG PO TABS: 1.0000 | ORAL_TABLET | ORAL | Status: DC | PRN

## 2016-03-04 MED ORDER — LIDOCAINE-EPINEPHRINE (PF) 2 %-1:200000 IJ SOLN
INTRAMUSCULAR | Status: DC | PRN
  Administered 2016-03-04: 4 mL

## 2016-03-04 MED ORDER — OXYCODONE-ACETAMINOPHEN 5-325 MG PO TABS: 2.0000 | ORAL_TABLET | ORAL | Status: DC | PRN

## 2016-03-04 MED ORDER — DIPHENHYDRAMINE HCL 50 MG/ML IJ SOLN: 12.5000 mg | INTRAMUSCULAR | Status: DC | PRN

## 2016-03-04 MED ORDER — BUPIVACAINE HCL (PF) 0.25 % IJ SOLN
INTRAMUSCULAR | Status: DC | PRN
  Administered 2016-03-04 (×2): 4 mL via EPIDURAL

## 2016-03-04 MED ORDER — ONDANSETRON HCL 4 MG/2ML IJ SOLN
4.0000 mg | Freq: Four times a day (QID) | INTRAMUSCULAR | Status: DC | PRN
  Administered 2016-03-05: 4 mg via INTRAVENOUS
  Filled 2016-03-04: qty 2

## 2016-03-04 MED ORDER — CITRIC ACID-SODIUM CITRATE 334-500 MG/5ML PO SOLN
30.0000 mL | ORAL | Status: DC | PRN
  Administered 2016-03-04: 30 mL via ORAL
  Filled 2016-03-04: qty 15

## 2016-03-04 MED ORDER — PENICILLIN G POTASSIUM 5000000 UNITS IJ SOLR
5.0000 10*6.[IU] | Freq: Once | INTRAVENOUS | Status: AC
  Administered 2016-03-04: 5 10*6.[IU] via INTRAVENOUS
  Filled 2016-03-04: qty 5

## 2016-03-04 MED ORDER — LACTATED RINGERS IV SOLN
500.0000 mL | INTRAVENOUS | Status: DC | PRN
  Administered 2016-03-05: 1000 mL via INTRAVENOUS

## 2016-03-04 MED ORDER — PENICILLIN G POTASSIUM 5000000 UNITS IJ SOLR
2.5000 10*6.[IU] | INTRAVENOUS | Status: DC
  Administered 2016-03-04 – 2016-03-05 (×4): 2.5 10*6.[IU] via INTRAVENOUS
  Filled 2016-03-04 (×9): qty 2.5

## 2016-03-04 MED ORDER — EPHEDRINE 5 MG/ML INJ
10.0000 mg | INTRAVENOUS | Status: DC | PRN
  Administered 2016-03-05: 8 mg via INTRAVENOUS
  Filled 2016-03-04: qty 2

## 2016-03-04 MED ORDER — PHENYLEPHRINE 40 MCG/ML (10ML) SYRINGE FOR IV PUSH (FOR BLOOD PRESSURE SUPPORT)
80.0000 ug | PREFILLED_SYRINGE | INTRAVENOUS | Status: AC | PRN
  Administered 2016-03-04 – 2016-03-05 (×2): 80 ug via INTRAVENOUS
  Administered 2016-03-05: 200 ug via INTRAVENOUS

## 2016-03-04 MED ORDER — LACTATED RINGERS IV SOLN
500.0000 mL | Freq: Once | INTRAVENOUS | Status: DC
Start: 2016-03-04 — End: 2016-03-06

## 2016-03-04 MED ORDER — LACTATED RINGERS IV BOLUS (SEPSIS)
1000.0000 mL | Freq: Once | INTRAVENOUS | Status: DC
Start: 2016-03-04 — End: 2016-03-05

## 2016-03-04 MED ORDER — BETAMETHASONE SOD PHOS & ACET 6 (3-3) MG/ML IJ SUSP
12.0000 mg | Freq: Once | INTRAMUSCULAR | Status: AC
  Administered 2016-03-04: 12 mg via INTRAMUSCULAR
  Filled 2016-03-04: qty 2

## 2016-03-04 MED ORDER — BUTORPHANOL TARTRATE 1 MG/ML IJ SOLN: 1.0000 mg | INTRAMUSCULAR | Status: DC | PRN

## 2016-03-04 MED ORDER — LACTATED RINGERS IV SOLN
INTRAVENOUS | Status: DC
  Administered 2016-03-05 (×2): via INTRAVENOUS

## 2016-03-04 MED ORDER — BUTORPHANOL TARTRATE 1 MG/ML IJ SOLN
1.0000 mg | Freq: Once | INTRAMUSCULAR | Status: AC
  Administered 2016-03-04: 1 mg via INTRAVENOUS
  Filled 2016-03-04: qty 1

## 2016-03-04 MED ORDER — FENTANYL 2.5 MCG/ML BUPIVACAINE 1/10 % EPIDURAL INFUSION (WH - ANES)
14.0000 mL/h | INTRAMUSCULAR | Status: DC | PRN
  Administered 2016-03-04: 12 mL/h via EPIDURAL
  Administered 2016-03-05 (×2): 14 mL/h via EPIDURAL
  Filled 2016-03-04 (×3): qty 125

## 2016-03-04 MED ORDER — ACETAMINOPHEN 325 MG PO TABS: 650.0000 mg | ORAL_TABLET | ORAL | Status: DC | PRN

## 2016-03-04 MED ORDER — GI COCKTAIL ~~LOC~~
30.0000 mL | Freq: Once | ORAL | Status: AC
  Administered 2016-03-04: 30 mL via ORAL
  Filled 2016-03-04: qty 30

## 2016-03-04 MED ORDER — PHENYLEPHRINE 40 MCG/ML (10ML) SYRINGE FOR IV PUSH (FOR BLOOD PRESSURE SUPPORT)
80.0000 ug | PREFILLED_SYRINGE | INTRAVENOUS | Status: DC | PRN
  Filled 2016-03-04: qty 2
  Filled 2016-03-04 (×3): qty 20

## 2016-03-04 MED ORDER — OXYTOCIN BOLUS FROM INFUSION: 500.0000 mL | INTRAVENOUS | Status: DC

## 2016-03-04 MED ORDER — LIDOCAINE HCL (PF) 1 % IJ SOLN
30.0000 mL | INTRAMUSCULAR | Status: DC | PRN
  Filled 2016-03-04: qty 30

## 2016-03-04 MED ORDER — EPHEDRINE 5 MG/ML INJ
10.0000 mg | INTRAVENOUS | Status: DC | PRN
  Filled 2016-03-04: qty 4
  Filled 2016-03-04: qty 2

## 2016-03-04 NOTE — H&P (Signed)
Aajah Uhde is a 35 y.o. female [phone] at 35+ with painful ctx, cervical change in MAU.  Admitted to L&D after cleared by NICU.  D/W pt possible labor - will admit and observe.  Until advanced cervical dilation will not augment, will give course of BMZ.    Maternal Medical History:  Reason for admission: Contractions.   Contractions: Frequency: irregular.   Perceived severity is moderate.    Fetal activity: Perceived fetal activity is normal.    Prenatal complications: Preterm labor.   Prenatal Complications - Diabetes: none.    OB History    Gravida Para Term Preterm AB TAB SAB Ectopic Multiple Living   5 2 2  2  2  0 0 2    SAB x2 37wkSVD 7#3 female 37wk SVD, 6#, female G5 current preg Last pap 3/15, wnl No STD   Past Medical History  Diagnosis Date  . Arthritis   . Dysphagia   . Shortness of breath dyspnea   . POTS (postural orthostatic tachycardia syndrome)    Past Surgical History  Procedure Laterality Date  . Abdominal exploration surgery     Family History: family history includes Cancer in her maternal grandmother and paternal grandfather; Diabetes in her maternal grandmother, mother, and paternal grandfather; Epilepsy in her sister; Heart Problems in her sister. Social History:  reports that she has never smoked. She has never used smokeless tobacco. She reports that she does not drink alcohol or use illicit drugs.married Meds PNV All NKDA   Prenatal Transfer Tool  Maternal Diabetes: No Genetic Screening: Normal Maternal Ultrasounds/Referrals: Abnormal:  Findings:   Isolated EIF (echogenic intracardiac focus), Fetal renal pyelectasis Fetal Ultrasounds or other Referrals:  None Maternal Substance Abuse:  No Significant Maternal Medications:  None Significant Maternal Lab Results:  None Other Comments:  GBBS unknown, Panorama low risk female  Review of Systems  Constitutional: Negative.   HENT: Negative.   Eyes: Negative.   Respiratory: Negative.    Cardiovascular: Negative.   Gastrointestinal: Negative.   Genitourinary: Negative.   Musculoskeletal: Negative.   Skin: Negative.   Neurological: Negative.   Psychiatric/Behavioral: Negative.     Dilation: 4.5 Effacement (%): 50 Station: -2 Exam by:: a. white rn Blood pressure 96/67, pulse 99, temperature 97.9 F (36.6 C), temperature source Oral, resp. rate 20, last menstrual period 06/28/2015, SpO2 88 %. Maternal Exam:  Uterine Assessment: Contraction strength is moderate.  Contraction frequency is regular.   Abdomen: Patient reports no abdominal tenderness. Fundal height is appropriate for gestation.   Estimated fetal weight is 5-6#.   Fetal presentation: vertex  Introitus: Normal vulva. Normal vagina.  Cervix: Cervix evaluated by digital exam.     Physical Exam  Constitutional: She is oriented to person, place, and time. She appears well-developed and well-nourished.  HENT:  Head: Normocephalic and atraumatic.  Cardiovascular: Normal rate and regular rhythm.   Respiratory: Effort normal and breath sounds normal. No respiratory distress. She has no wheezes.  GI: Soft. Bowel sounds are normal. She exhibits no distension. There is no tenderness.  Musculoskeletal: Normal range of motion.  Neurological: She is alert and oriented to person, place, and time.  Skin: Skin is warm and dry.  Psychiatric: She has a normal mood and affect. Her behavior is normal.    Prenatal labs: ABO, Rh: --/--/O POS (03/25 1837) Antibody: NEG (03/25 1837) Rubella: Immune (09/23 0000) RPR: Nonreactive (09/23 0000)  HBsAg: Negative (09/23 0000)  HIV: Non Reactive (09/06 1716)  GBS:   unknown  EDC  by LMP c/w First tri [place] Panorama low risk, female Nl NT, previa resolved [place] nl anat x EIF, fetal pyelectasis Good growth  Hgb 12.6/Plt 320K/Ur Cx neg/Chl neg/ GC neg/CF neg/glucola 167 - 3hr with 1 abn value  Assessment/Plan: 35yo [id number] at 35+ with cervical change PCN for GBBS  prophylaxis BMZ for fetal lung maturity Epidural prn for pain Close monitoring   Bovard-Stuckert, Adrijana Haros 03/04/2016, 9:21 PM

## 2016-03-04 NOTE — MAU Note (Signed)
Pt C/O uc's & cramping all during the night, becoming more frequent now.  Advised by Dr. Melba Coon to come in.  Had ? Gush of fluid this morning, no leaking since.  Denies bleeding, has pink discharge.

## 2016-03-04 NOTE — Anesthesia Procedure Notes (Signed)
Epidural Patient location during procedure: OB  Staffing Anesthesiologist: Ladarian Bonczek Performed by: anesthesiologist   Preanesthetic Checklist Completed: patient identified, surgical consent, pre-op evaluation, timeout performed, IV checked, risks and benefits discussed and monitors and equipment checked  Epidural Patient position: sitting Prep: DuraPrep Patient monitoring: heart rate, cardiac monitor, continuous pulse ox and blood pressure Approach: midline Location: L3-L4 Injection technique: LOR saline  Needle:  Needle type: Tuohy  Needle gauge: 17 G Needle length: 9 cm Needle insertion depth: 6 cm Catheter type: closed end flexible Catheter size: 19 Gauge Catheter at skin depth: 12 cm Test dose: negative and 2% lidocaine with Epi 1:200 K  Assessment Events: blood not aspirated, injection not painful, no injection resistance, negative IV test and no paresthesia  Additional Notes Reason for block:procedure for pain   

## 2016-03-04 NOTE — MAU Provider Note (Signed)
History     CSN: [id number]  Arrival date and time: 03/04/16 1516   First Provider Initiated Contact with Patient 03/04/16 1540      Chief Complaint  Patient presents with  . Labor Eval   HPI Ms. Kathy Dorsey is a 35 y.o. [id number] at [redacted]w[redacted]d who presents to MAU today with complaint of contractions and possible LOF. The patient states cramping all night has progressed to regular contractions this afternoon. She states q 5-6 minutes prior to arrival and now maybe closer and more uncomfortable. They have been progressively more painful throughout the day as well. She has had pink discharge for the last few days and noted a very small gush of fluid this morning that was colorless. She denies continued LOF. She denies complications with the pregnancy. She reports good fetal movement.   OB History    Gravida Para Term Preterm AB TAB SAB Ectopic Multiple Living   5 2 2  2  2  0 0 2      Past Medical History  Diagnosis Date  . Arthritis   . Dysphagia   . Shortness of breath dyspnea     Past Surgical History  Procedure Laterality Date  . Abdominal exploration surgery      Family History  Problem Relation Age of Onset  . Rheum arthritis    . Diabetes Mother   . Cancer Maternal Grandmother   . Diabetes Maternal Grandmother   . Diabetes Paternal Grandfather   . Cancer Paternal Grandfather   . Epilepsy Sister   . Heart Problems Sister     Social History  Substance Use Topics  . Smoking status: Never Smoker   . Smokeless tobacco: Never Used  . Alcohol Use: No    Allergies:  Allergies  Allergen Reactions  . Prednisone Palpitations and Other (See Comments)    Reaction:  Joint pain     Prescriptions prior to admission  Medication Sig Dispense Refill Last Dose  . calcium carbonate (TUMS - DOSED IN MG ELEMENTAL CALCIUM) 500 MG chewable tablet Chew 2 tablets by mouth as needed for indigestion or heartburn.   03/04/2016 at Unknown time  . ferrous sulfate 325 (65 FE) MG tablet  Take 325 mg by mouth daily with breakfast.   03/03/2016 at Unknown time  . Prenatal Vit-Fe Fumarate-FA (PRENATAL MULTIVITAMIN) TABS tablet Take 1 tablet by mouth daily.    03/03/2016 at Unknown time  . ranitidine (ZANTAC) 150 MG tablet Take 150 mg by mouth 2 (two) times daily as needed for heartburn.    03/03/2016 at Unknown time    Review of Systems  Constitutional: Negative for fever and malaise/fatigue.  Gastrointestinal: Positive for nausea and abdominal pain. Negative for vomiting, diarrhea and constipation.  Genitourinary: Negative for dysuria, urgency and frequency.       + spotting, discharge, LOF   Physical Exam   Blood pressure 117/76, pulse 126, temperature 97.7 F (36.5 C), temperature source Oral, resp. rate 18, last menstrual period 06/28/2015.  Physical Exam  Nursing note and vitals reviewed. Constitutional: She is oriented to person, place, and time. She appears well-developed and well-nourished. No distress.  HENT:  Head: Normocephalic and atraumatic.  Cardiovascular: Normal rate.   Respiratory: Effort normal.  GI: Soft. She exhibits no distension and no mass. There is no tenderness. There is no rebound and no guarding.  Neurological: She is alert and oriented to person, place, and time.  Skin: Skin is warm and dry. No erythema.  Psychiatric: She has a  normal mood and affect.  Dilation: 4.5 Effacement (%): 70 Cervical Position: Middle Station: -3 Presentation: Vertex Exam by:: Tomi Bamberger PA   Results for orders placed or performed during the hospital encounter of 03/04/16 (from the past 24 hour(s))  Urinalysis, Routine w reflex microscopic (not at [facility])     Status: None   Collection Time: 03/04/16  3:31 PM  Result Value Ref Range   Color, Urine YELLOW YELLOW   APPearance CLEAR CLEAR   Specific Gravity, Urine 1.010 1.005 - 1.030   pH 5.5 5.0 - 8.0   Glucose, UA NEGATIVE NEGATIVE mg/dL   Hgb urine dipstick NEGATIVE NEGATIVE   Bilirubin Urine NEGATIVE NEGATIVE    Ketones, ur NEGATIVE NEGATIVE mg/dL   Protein, ur NEGATIVE NEGATIVE mg/dL   Nitrite NEGATIVE NEGATIVE   Leukocytes, UA NEGATIVE NEGATIVE    Fetal Monitoring: Baseline: 140 bpm Variability: moderate Accelerations: 15 x 15 Decelerations: none Contractions: q 2-4 minutes  MAU Course  Procedures None  MDM UA today Discussed with Dr. Melba Coon. Recommends IV fluid bolus and recheck in 1 hour At recheck patient is 3.5/50/-3. Contractions have continued q 2-4 minutes. Patient feels that they are more painful.  Discussed update with Dr. Melba Coon. Recommends 1 mg IV Stadol and continued monitoring x 1 hour, then recheck.  At recheck patient is 4.5/70/-3. Contractions are q 4 minutes and more painful according to the patient.  Discussed update with Dr. Melba Coon. She will consult NICU prior to admission, but plans to admit to L&D.  Assessment and Plan  A: SIUP at [redacted]w[redacted]d Preterm labor  P: Admit to L&D  Luvenia Redden, PA-C  03/04/2016, 6:06 PM

## 2016-03-04 NOTE — Consults (Signed)
  Anesthesia Pain Consult Note  Patient: Kathy Dorsey, 35 y.o., female  Consult Requested by: Janyth Contes, MD  Reason for Consult: CRNA Pain Management Epidural in place

## 2016-03-04 NOTE — Anesthesia Preprocedure Evaluation (Addendum)
Anesthesia Evaluation  Patient identified by MRN, date of birth, ID band Patient awake    Reviewed: Allergy & Precautions, Patient's Chart, lab work & pertinent test results  History of Anesthesia Complications Negative for: history of anesthetic complications  Airway Mallampati: II  TM Distance: >3 FB Neck ROM: Full    Dental  (+) Teeth Intact   Pulmonary neg pulmonary ROS,    breath sounds clear to auscultation       Cardiovascular  Rhythm:Regular  P.O.T.S   Neuro/Psych negative neurological ROS  negative psych ROS   GI/Hepatic negative GI ROS, Neg liver ROS,   Endo/Other  negative endocrine ROS  Renal/GU negative Renal ROS     Musculoskeletal  (+) Arthritis ,   Abdominal   Peds  Hematology negative hematology ROS (+)   Anesthesia Other Findings   Reproductive/Obstetrics (+) Pregnancy                            Anesthesia Physical Anesthesia Plan  ASA: II  Anesthesia Plan: Epidural   Post-op Pain Management:    Induction:   Airway Management Planned:   Additional Equipment:   Intra-op Plan:   Post-operative Plan:   Informed Consent: I have reviewed the patients History and Physical, chart, labs and discussed the procedure including the risks, benefits and alternatives for the proposed anesthesia with the patient or authorized representative who has indicated his/her understanding and acceptance.   Dental advisory given  Plan Discussed with: Anesthesiologist  Anesthesia Plan Comments:         Anesthesia Quick Evaluation

## 2016-03-05 ENCOUNTER — Encounter (HOSPITAL_COMMUNITY): Payer: Self-pay | Admitting: Obstetrics and Gynecology

## 2016-03-05 DIAGNOSIS — IMO0001 Reserved for inherently not codable concepts without codable children: Secondary | ICD-10-CM

## 2016-03-05 LAB — AMNISURE RUPTURE OF MEMBRANE (ROM) NOT AT ARMC: Amnisure ROM: POSITIVE

## 2016-03-05 LAB — RPR: RPR Ser Ql: NONREACTIVE

## 2016-03-05 MED ORDER — OXYTOCIN BOLUS FROM INFUSION: 500.0000 mL | INTRAVENOUS | Status: DC

## 2016-03-05 MED ORDER — ONDANSETRON HCL 4 MG PO TABS: 4.0000 mg | ORAL_TABLET | ORAL | Status: DC | PRN

## 2016-03-05 MED ORDER — ZOLPIDEM TARTRATE 5 MG PO TABS: 5.0000 mg | ORAL_TABLET | Freq: Every evening | ORAL | Status: DC | PRN

## 2016-03-05 MED ORDER — ONDANSETRON HCL 4 MG/2ML IJ SOLN: 4.0000 mg | INTRAMUSCULAR | Status: DC | PRN

## 2016-03-05 MED ORDER — OXYTOCIN 10 UNIT/ML IJ SOLN
1.0000 m[IU]/min | INTRAVENOUS | Status: DC
  Administered 2016-03-05 (×2): 2 m[IU]/min via INTRAVENOUS
  Filled 2016-03-05: qty 10

## 2016-03-05 MED ORDER — OXYTOCIN 10 UNIT/ML IJ SOLN
1.0000 m[IU]/min | INTRAVENOUS | Status: DC
  Administered 2016-03-05: 3 m[IU]/min via INTRAVENOUS

## 2016-03-05 MED ORDER — SENNOSIDES-DOCUSATE SODIUM 8.6-50 MG PO TABS
2.0000 | ORAL_TABLET | ORAL | Status: DC
  Administered 2016-03-06: 2 via ORAL
  Filled 2016-03-05: qty 2

## 2016-03-05 MED ORDER — SIMETHICONE 80 MG PO CHEW: 80.0000 mg | CHEWABLE_TABLET | ORAL | Status: DC | PRN

## 2016-03-05 MED ORDER — ACETAMINOPHEN 325 MG PO TABS: 650.0000 mg | ORAL_TABLET | ORAL | Status: DC | PRN

## 2016-03-05 MED ORDER — LANOLIN HYDROUS EX OINT: TOPICAL_OINTMENT | CUTANEOUS | Status: DC | PRN

## 2016-03-05 MED ORDER — ONDANSETRON HCL 4 MG/2ML IJ SOLN: 4.0000 mg | Freq: Four times a day (QID) | INTRAMUSCULAR | Status: DC | PRN

## 2016-03-05 MED ORDER — OXYCODONE-ACETAMINOPHEN 5-325 MG PO TABS: 2.0000 | ORAL_TABLET | ORAL | Status: DC | PRN

## 2016-03-05 MED ORDER — OXYCODONE-ACETAMINOPHEN 5-325 MG PO TABS: 1.0000 | ORAL_TABLET | ORAL | Status: DC | PRN

## 2016-03-05 MED ORDER — IBUPROFEN 600 MG PO TABS
600.0000 mg | ORAL_TABLET | Freq: Four times a day (QID) | ORAL | Status: DC
  Administered 2016-03-05 – 2016-03-06 (×5): 600 mg via ORAL
  Filled 2016-03-05 (×5): qty 1

## 2016-03-05 MED ORDER — FAMOTIDINE 20 MG PO TABS
20.0000 mg | ORAL_TABLET | Freq: Every day | ORAL | Status: DC
Start: 2016-03-05 — End: 2016-03-06
  Administered 2016-03-06: 20 mg via ORAL
  Filled 2016-03-05: qty 1

## 2016-03-05 MED ORDER — DIBUCAINE 1 % RE OINT: 1.0000 "application " | TOPICAL_OINTMENT | RECTAL | Status: DC | PRN

## 2016-03-05 MED ORDER — LACTATED RINGERS IV SOLN: INTRAVENOUS | Status: DC

## 2016-03-05 MED ORDER — CALCIUM CARBONATE ANTACID 500 MG PO CHEW: 2.0000 | CHEWABLE_TABLET | ORAL | Status: DC | PRN

## 2016-03-05 MED ORDER — FLEET ENEMA 7-19 GM/118ML RE ENEM: 1.0000 | ENEMA | RECTAL | Status: DC | PRN

## 2016-03-05 MED ORDER — PRENATAL MULTIVITAMIN CH: 1.0000 | ORAL_TABLET | Freq: Every day | ORAL | Status: DC

## 2016-03-05 MED ORDER — CITRIC ACID-SODIUM CITRATE 334-500 MG/5ML PO SOLN: 30.0000 mL | ORAL | Status: DC | PRN

## 2016-03-05 MED ORDER — BENZOCAINE-MENTHOL 20-0.5 % EX AERO: 1.0000 "application " | INHALATION_SPRAY | CUTANEOUS | Status: DC | PRN

## 2016-03-05 MED ORDER — PRENATAL MULTIVITAMIN CH
1.0000 | ORAL_TABLET | Freq: Every day | ORAL | Status: DC
  Administered 2016-03-06: 1 via ORAL
  Filled 2016-03-05: qty 1

## 2016-03-05 MED ORDER — DIPHENHYDRAMINE HCL 25 MG PO CAPS: 25.0000 mg | ORAL_CAPSULE | Freq: Four times a day (QID) | ORAL | Status: DC | PRN

## 2016-03-05 MED ORDER — TERBUTALINE SULFATE 1 MG/ML IJ SOLN
0.2500 mg | Freq: Once | INTRAMUSCULAR | Status: DC | PRN
  Filled 2016-03-05: qty 1

## 2016-03-05 MED ORDER — LACTATED RINGERS IV SOLN: 500.0000 mL | INTRAVENOUS | Status: DC | PRN

## 2016-03-05 MED ORDER — WITCH HAZEL-GLYCERIN EX PADS: 1.0000 "application " | MEDICATED_PAD | CUTANEOUS | Status: DC | PRN

## 2016-03-05 MED ORDER — TETANUS-DIPHTH-ACELL PERTUSSIS 5-2.5-18.5 LF-MCG/0.5 IM SUSP
0.5000 mL | Freq: Once | INTRAMUSCULAR | Status: AC
  Administered 2016-03-06: 0.5 mL via INTRAMUSCULAR
  Filled 2016-03-05: qty 0.5

## 2016-03-05 MED ORDER — OXYTOCIN 10 UNIT/ML IJ SOLN: 2.5000 [IU]/h | INTRAVENOUS | Status: DC

## 2016-03-05 MED ORDER — LIDOCAINE HCL (PF) 1 % IJ SOLN
30.0000 mL | INTRAMUSCULAR | Status: DC | PRN
Start: 2016-03-05 — End: 2016-03-05
  Filled 2016-03-05: qty 30

## 2016-03-05 NOTE — Progress Notes (Signed)
Patient ID: Kathy Dorsey, female   DOB: 10-Aug-1981, 35 y.o.   MRN: [id number]  Pt with decel to 70-90's.  Good variability  Resolved with position change  AROM of forebag with clear fluid, no complications Will continue IOL for PROM  FSE applied  Close monitoring

## 2016-03-05 NOTE — Lactation Note (Addendum)
This note was copied from a baby's chart. Lactation Consultation Note  P2, 66 4 hours old. [redacted]w[redacted]d. Taught mother hand expression and gave baby a few drops on spoon. Assisted w/ latching sleepy infant on breast in football hold.  Baby latched for 10 min. Reviewed DEBP with mother.  Discussed milk storage and cleaning.  Suggest she post pump 4-6x a day for 10-15 and give baby back volume pumped on spoon. Reviewed milk storage and cleaning.  Encouraged STS. Suggest mother hand express before and after pumping. Discussed LPI infant feeding behavior and gave parents information sheet to read. Reminded parents to feed baby every 3 hours, limited feedings to 30 min and call for assistance. Mom made aware of O/P services, breastfeeding support groups, community resources, and our phone # for post-discharge questions.    Patient Name: Kathy Dorsey [id number] Date: 03/05/2016 Reason for consult: Late preterm infant;Initial assessment   Maternal Data Has patient been taught Hand Expression?: Yes Does the patient have breastfeeding experience prior to this delivery?: Yes  Feeding Feeding Type: Breast Fed Length of feed: 10 min  LATCH Score/Interventions Latch: Repeated attempts needed to sustain latch, nipple held in mouth throughout feeding, stimulation needed to elicit sucking reflex.  Audible Swallowing: A few with stimulation  Type of Nipple: Everted at rest and after stimulation  Comfort (Breast/Nipple): Soft / non-tender     Hold (Positioning): Assistance needed to correctly position infant at breast and maintain latch.  LATCH Score: 7  Lactation Tools Discussed/Used Pump Review: Setup, frequency, and cleaning;Milk Storage Initiated by:: Vivianne Master Date initiated:: 03/06/16   Consult Status Consult Status: Follow-up Date: 03/06/16 Follow-up type: In-patient    Vivianne Master [facility] 03/05/2016, 8:43 PM

## 2016-03-05 NOTE — Progress Notes (Signed)
Rapid response-house coverage called

## 2016-03-06 LAB — CBC
HEMATOCRIT: 27.5 % — AB (ref 36.0–46.0)
HEMOGLOBIN: 9.3 g/dL — AB (ref 12.0–15.0)
MCH: 29.2 pg (ref 26.0–34.0)
MCHC: 33.8 g/dL (ref 30.0–36.0)
MCV: 86.2 fL (ref 78.0–100.0)
Platelets: 204 10*3/uL (ref 150–400)
RBC: 3.19 MIL/uL — AB (ref 3.87–5.11)
RDW: 15 % (ref 11.5–15.5)
WBC: 12.8 10*3/uL — AB (ref 4.0–10.5)

## 2016-03-06 MED ORDER — OSELTAMIVIR PHOSPHATE 75 MG PO CAPS
75.0000 mg | ORAL_CAPSULE | Freq: Every day | ORAL | Status: DC
  Administered 2016-03-06: 75 mg via ORAL
  Filled 2016-03-06 (×2): qty 1

## 2016-03-06 MED ORDER — PRENATAL MULTIVITAMIN CH: 1.0000 | ORAL_TABLET | Freq: Every day | ORAL | Status: DC

## 2016-03-06 MED ORDER — IBUPROFEN 800 MG PO TABS: 800.0000 mg | ORAL_TABLET | Freq: Three times a day (TID) | ORAL | Status: DC | PRN

## 2016-03-06 NOTE — Lactation Note (Signed)
This note was copied from a baby's chart. Lactation Consultation Note  Patient Name: Kathy Dorsey [id number] Date: 03/06/2016 Reason for consult: Follow-up assessment;Late preterm infant Mom reports baby just BF for 10 minutes. He is now sleepy. Mom getting ready to supplement with formula 10 ml via curved tipped syringe per LPT guidelines. Feeding plan discussed with Mom is to BF with each feeding for 15-30 minutes. Post pump for 15 minutes and supplement with EBM/formula according to LPT policy increasing to 10 ml today. Mom reports she has DEBP for home use. Discussed LPT behaviors and reasons for supplementing and limiting time at breast. If baby BF well and taking supplement during the day/evening, then advised Mom to just BF at night and in the AM add the supplements and pumping again till milk is in and weight loss stabilizes. Mom to call with next feeding for LC to observe latch.   Maternal Data    Feeding Feeding Type: Breast Fed Length of feed: 10 min  LATCH Score/Interventions                      Lactation Tools Discussed/Used Tools: Pump Breast pump type: Double-Electric Breast Pump   Consult Status Consult Status: Follow-up Date: 03/06/16    Katrine Coho 03/06/2016, 3:03 PM

## 2016-03-06 NOTE — Discharge Summary (Signed)
OB Discharge Summary     Patient Name: Kathy Dorsey DOB: 1981/01/29 MRN: [id number]  Date of admission: 03/04/2016 Delivering MD: Janyth Contes   Date of discharge: 03/06/2016  Admitting diagnosis: 35.5 WKS, LABOR EVAL Intrauterine pregnancy: [redacted]w[redacted]d     Secondary diagnosis:  Principal Problem:   SVD (spontaneous vaginal delivery) Active Problems:   Preterm labor in third trimester without delivery   Active labor  Additional problems: N/A     Discharge diagnosis: Preterm Pregnancy Delivered                                                                                                Post partum procedures:N/A  Augmentation: Pitocin  Complications: None  Hospital course:  Onset of Labor With Vaginal Delivery     35 y.o. yo [id number] at [redacted]w[redacted]d was admitted in Latent Labor on 03/04/2016. Patient had an uncomplicated labor course as follows:  Membrane Rupture Time/Date: 7:25 AM ,03/05/2016  augmented after Intrapartum Procedures: Episiotomy: None [1]                                         Lacerations:  1st degree [2];Labial [10];Vaginal [6]  Patient had a delivery of a Viable infant. 03/05/2016  Information for the patient's newborn:  [place]aut[place], Desantiago [id number]  Delivery Method: Vaginal, Spontaneous Delivery (Filed from Delivery Summary)    Pateint had an uncomplicated postpartum course.  She is ambulating, tolerating a regular diet, passing flatus, and urinating well. Patient is discharged home in stable condition on 03/06/2016.    Physical exam  Filed Vitals:   03/05/16 1747 03/05/16 1901 03/05/16 2114 03/06/16 0539  BP: 106/73 109/54 107/54 93/57  Pulse: 91 82 100 99  Temp: 98.7 F (37.1 C) 98 F (36.7 C) 98.4 F (36.9 C) 97.7 F (36.5 C)  TempSrc: Oral Oral Oral Oral  Resp: 18 18 19 19   Height:      Weight:      SpO2:    99%   General: alert and no distress Lochia: appropriate Uterine Fundus: firm Labs: Lab Results  Component Value Date   WBC 12.8*  03/06/2016   HGB 9.3* 03/06/2016   HCT 27.5* 03/06/2016   MCV 86.2 03/06/2016   PLT 204 03/06/2016   No flowsheet data found.  Discharge instruction: per After Visit Summary and "Baby and Me Booklet".  After visit meds:    Medication List    TAKE these medications        calcium carbonate 500 MG chewable tablet  Commonly known as:  TUMS - dosed in mg elemental calcium  Chew 2 tablets by mouth as needed for indigestion or heartburn.     ferrous sulfate 325 (65 FE) MG tablet  Take 325 mg by mouth daily with breakfast.     ibuprofen 800 MG tablet  Commonly known as:  ADVIL,MOTRIN  Take 1 tablet (800 mg total) by mouth every 8 (eight) hours as needed.     prenatal multivitamin Tabs tablet  Take  1 tablet by mouth daily.     ranitidine 150 MG tablet  Commonly known as:  ZANTAC  Take 150 mg by mouth 2 (two) times daily as needed for heartburn.        Diet: routine diet  Activity: Advance as tolerated. Pelvic rest for 6 weeks.   Outpatient follow up:6 weeks Follow up Appt:No future appointments. Follow up Visit:No Follow-up on file.  Postpartum contraception: Undecided  Newborn Data: Live born female  Birth Weight: 6 lb 15.1 oz (3150 g) APGAR: 7, 8  Baby Feeding: Breast Disposition:home with mother   03/06/2016 Janyth Contes, MD

## 2016-03-06 NOTE — Progress Notes (Signed)
Patient ID: Kathy Dorsey, female   DOB: [birth year], 35 y.o.   MRN: [id number] Pt reports 35 year old diagnosed with the flu.  All of family on tamiflu and we will start her tonight to go home with it.  She will discuss with pediatrician as well.

## 2016-03-06 NOTE — Anesthesia Postprocedure Evaluation (Signed)
Anesthesia Post Note  Patient: Kathy Dorsey  Procedure(s) Performed: * No procedures listed *  Patient location during evaluation: Mother Baby Anesthesia Type: Epidural Level of consciousness: oriented, awake and alert and awake Pain management: pain level controlled Vital Signs Assessment: post-procedure vital signs reviewed and stable Respiratory status: spontaneous breathing Cardiovascular status: stable Postop Assessment: patient able to bend at knees, no signs of nausea or vomiting and adequate PO intake Anesthetic complications: no    Last Vitals:  Filed Vitals:   03/05/16 2114 03/06/16 0539  BP: 107/54 93/57  Pulse: 100 99  Temp: 36.9 C 36.5 C  Resp: 19 19    Last Pain:  Filed Vitals:   03/06/16 0544  PainSc: [place]

## 2016-03-06 NOTE — Progress Notes (Signed)
Post Partum Day 1 Subjective: no complaints, up ad lib, voiding, tolerating PO and nl lochia, pain controlled  Objective: Blood pressure 93/57, pulse 99, temperature 97.7 F (36.5 C), temperature source Oral, resp. rate 19, height 5\' 4"  (1.626 m), weight 83.915 kg (185 lb), last menstrual period 06/28/2015, SpO2 99 %, unknown if currently breastfeeding.  Physical Exam:  General: alert and no distress Lochia: appropriate Uterine Fundus: firm   Recent Labs  03/04/16 1837 03/06/16 0537  HGB 11.1* 9.3*  HCT 33.0* 27.5*    Assessment/Plan: Plan for discharge today if cleared with peds or  tomorrow, Breastfeeding and Lactation consult.  D/C with motrin and PNV, f/u 6 weeks.   LOS: 2 days   Bovard-Stuckert, Babetta Paterson 03/06/2016, 7:56 AM

## 2016-03-07 ENCOUNTER — Ambulatory Visit: Payer: Self-pay

## 2016-03-07 NOTE — Lactation Note (Signed)
This note was copied from a baby's chart. Lactation Consultation Note  Patient Name: Boy Kamora Courtney [id number] Date: 03/07/2016   Baby 53 hrs old, to stay another day as "baby patient".  Mom has a great routine going with breast feeding, baby feeding 5-15 minutes on breast, and then supplementing baby with curved tip syringe.  To increase supplement to 20-30 ml late this afternoon.  Pumping every 3 hrs, for 15 minutes.  Encouraged manual expression and massage in addition to using DEBP.  Recommended skin to skin and feeding baby at 3 hrs, or sooner if baby cues.  Mom to call prn for assistance.     Broadus John 03/07/2016, 10:53 AM

## 2016-03-08 ENCOUNTER — Ambulatory Visit: Payer: Self-pay

## 2016-03-08 NOTE — Lactation Note (Signed)
This note was copied from a baby's chart. Lactation Consultation Note  Patient Name: Kathy Dorsey [id number] Date: 03/08/2016 Reason for consult: Follow-up assessment;Late preterm infant Mom had just fed baby before this visit. Mom is trying to BF with each feeding, sometimes baby sleepy and will not latch. She is supplementing per LPT policy. Pumping every 3 hours for 15 minutes and starting to see more colostrum. Baby still under Double photo therapy at this time. Encouraged Mom to call for assist with latch. Mom has DEBP for home use if d/c today. Mom has good feeding plan.   Maternal Data    Feeding Feeding Type: Breast Milk with Formula added Length of feed: 0 min  LATCH Score/Interventions                      Lactation Tools Discussed/Used Tools: Pump Breast pump type: Double-Electric Breast Pump   Consult Status Consult Status: Follow-up Date: 03/08/16 Follow-up type: In-patient    Katrine Coho 03/08/2016, 12:20 PM

## 2016-09-27 ENCOUNTER — Telehealth: Payer: Self-pay | Admitting: *Deleted

## 2016-09-27 NOTE — Telephone Encounter (Signed)
Unable to reach patient at time of Pre-Visit Call.  Left message for patient to return call when available.    

## 2016-09-28 ENCOUNTER — Encounter: Payer: Self-pay | Admitting: Emergency Medicine

## 2016-09-28 ENCOUNTER — Ambulatory Visit (INDEPENDENT_AMBULATORY_CARE_PROVIDER_SITE_OTHER): Payer: Commercial Managed Care - PPO | Admitting: Family Medicine

## 2016-09-28 VITALS — BP 103/80 | HR 87 | Temp 98.2°F | Ht 64.0 in | Wt 154.6 lb

## 2016-09-28 DIAGNOSIS — M791 Myalgia, unspecified site: Secondary | ICD-10-CM

## 2016-09-28 DIAGNOSIS — F451 Undifferentiated somatoform disorder: Secondary | ICD-10-CM

## 2016-09-28 DIAGNOSIS — F418 Other specified anxiety disorders: Secondary | ICD-10-CM | POA: Diagnosis not present

## 2016-09-28 HISTORY — DX: Other specified anxiety disorders: F41.8

## 2016-09-28 MED ORDER — FLUOXETINE HCL 20 MG PO TABS: 20.0000 mg | ORAL_TABLET | Freq: Every day | ORAL | 6 refills | Status: DC

## 2016-09-28 NOTE — Patient Instructions (Signed)
It was good to see you today- let's try using fluoxetine 20 mg once a day for your anxiety/ feeling overwhelmed. Let me know if you have any problems with this, but my hope is that you will gradually notice that you are feeling less overwhelmed. I will refer you to see another rheumatologist for a second opinion

## 2016-09-28 NOTE — Progress Notes (Signed)
[place] [facility] [address], [place], [place] [address] [phone]  Date:  09/28/2016   Name:  Kathy Dorsey   DOB:  May 14, 1981   MRN:  [id number]  PCP:  Lamar Blinks, MD    Chief Complaint: Establish Care (Pt here to est care Former pcp has left the practice. )   History of Present Illness:  Kathy Dorsey is a 35 y.o. very pleasant female patient who presents with the following:  Here today as a new patient- her last PCP left her practice so she is looking to establish care.   She is currently being evaluated for an illness which so far has not been dx- reports that she has seen neurology, endocrinology, rheumatology, cardiology in the last couple of years She has been dx with POTS, endometriosis, myoclonus, herniated cervical disc and thoracic arthritis  She brings a list of symptoms including "joint pains, swollen and painful nodes. Ulcers in her mouth and nose, hair loss, recurring chest pain when inhaling. 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 They are 9, 9 and 6 months old.   She just stopped nursing her infant a few weeks ago.  Her oldest son does have autism- he is high functioning and does well.    Her mom had DM 2,no other significant family history except her sister has a congenital heart condition and epilepsy.    She has had an extensive lab/ imaging evaluation.  She states that her neurologist wants to do a muscle biopsy.  She has had nerve conduction studies that may have showed a "mild slowing of the nerve conduction"  She lives in [place]- would like to seek a second rheumatology opinion.   She has noted some anxiety and will sometimes feel overwhelmed with the new baby.  She is not really having crying jags. No SI or HI.  She is open to the idea of treatment for anxiety with an SSRI.  She states that  she does not feel that she is depressed however   Patient Active Problem List   Diagnosis Date Noted  . Active labor 03/05/2016  . SVD (spontaneous vaginal delivery) 03/05/2016  . Preterm labor in third trimester without delivery 03/04/2016    Past Medical History:  Diagnosis Date  . Arthritis   . Dysphagia   . POTS (postural orthostatic tachycardia syndrome)   . Shortness of breath dyspnea   . SVD (spontaneous vaginal delivery) 03/05/2016    Past Surgical History:  Procedure Laterality Date  . ABDOMINAL EXPLORATION SURGERY      Social History  Substance Use Topics  . Smoking status: Never Smoker  . Smokeless tobacco: Never Used  . Alcohol use No    Family History  Problem Relation Age of Onset  . Diabetes Mother   . Rheum arthritis    . Cancer Maternal Grandmother   . Diabetes Maternal Grandmother   . Diabetes Paternal Grandfather   . Cancer Paternal Grandfather   . Epilepsy Sister   . Heart Problems Sister     Allergies  Allergen Reactions  . Prednisone Palpitations and Other (See Comments)    Reaction:  Joint pain     Medication list has been reviewed and updated.  Current Outpatient Prescriptions on File Prior to Visit  Medication Sig Dispense Refill  . calcium carbonate (TUMS - DOSED IN MG ELEMENTAL CALCIUM) 500  MG chewable tablet Chew 2 tablets by mouth as needed for indigestion or heartburn.    . ferrous sulfate 325 (65 FE) MG tablet Take 325 mg by mouth daily with breakfast.    . ibuprofen (ADVIL,MOTRIN) 800 MG tablet Take 1 tablet (800 mg total) by mouth every 8 (eight) hours as needed. 45 tablet 1  . Prenatal Vit-Fe Fumarate-FA (PRENATAL MULTIVITAMIN) TABS tablet Take 1 tablet by mouth daily. 100 tablet 3  . ranitidine (ZANTAC) 150 MG tablet Take 150 mg by mouth 2 (two) times daily as needed for heartburn.      No current facility-administered medications on file prior to visit.     Review of Systems:  As per HPI- otherwise  negative.   Physical Examination: Blood pressure 103/80, pulse 87, temperature 98.2 F (36.8 C), temperature source Oral, height 5\' 4"  (1.626 m), weight 154 lb 9.6 oz (70.1 kg), last menstrual period 08/30/2016, SpO2 100 %, unknown if currently breastfeeding.  Vitals:   09/28/16 1712  Weight: 154 lb 9.6 oz (70.1 kg)  Height: 5\' 4"  (1.626 m)   Body mass index is 26.54 kg/m. Ideal Body Weight: Weight in (lb) to have BMI = 25: 145.3  GEN: WDWN, NAD, Non-toxic, A & O x 3, looks very healthy  HEENT: Atraumatic, Normocephalic. Neck supple. No masses, No LAD.  Bilateral TM wnl, oropharynx normal.  PEERL,EOMI.   Ears and Nose: No external deformity. CV: RRR, No M/G/R. No JVD. No thrill. No extra heart sounds. PULM: CTA B, no wheezes, crackles, rhonchi. No retractions. No resp. distress. No accessory muscle use. ABD: S, NT, ND EXTR: No c/c/e NEURO Normal gait.  PSYCH: Normally interactive. Conversant. Not depressed or anxious appearing.  Calm demeanor.    Assessment and Plan: Situational anxiety - Plan: FLUoxetine (PROZAC) 20 MG tablet  Muscle ache - Plan: Ambulatory referral to Rheumatology  Here today with a list of symptoms she has seen multiple providers and had an extensive evaluation.  I do not have acces to most of her records, but am happy to refer her for another rheumatolgist. I also did approach the idea that we may not be able to find a medical explanation for these sx, and may soon need to stop testing and focus on maximizing her function.  The SSRi may help her here.  She will start on 20 mg for now, will plan to see me in 2-3 months to check on her progress.    See patient instructions for more details.     Signed Lamar Blinks, MD

## 2016-09-28 NOTE — Progress Notes (Signed)
Pre visit review using our clinic review tool, if applicable. No additional management support is needed unless otherwise documented below in the visit note. 

## 2016-12-12 DIAGNOSIS — Z1389 Encounter for screening for other disorder: Secondary | ICD-10-CM | POA: Diagnosis not present

## 2016-12-12 DIAGNOSIS — Z01419 Encounter for gynecological examination (general) (routine) without abnormal findings: Secondary | ICD-10-CM | POA: Diagnosis not present

## 2016-12-12 DIAGNOSIS — Z124 Encounter for screening for malignant neoplasm of cervix: Secondary | ICD-10-CM | POA: Diagnosis not present

## 2016-12-15 DIAGNOSIS — R768 Other specified abnormal immunological findings in serum: Secondary | ICD-10-CM | POA: Diagnosis not present

## 2016-12-27 ENCOUNTER — Ambulatory Visit: Payer: Commercial Managed Care - PPO | Admitting: Family Medicine

## 2017-01-01 ENCOUNTER — Encounter: Payer: Self-pay | Admitting: Family Medicine

## 2017-01-01 ENCOUNTER — Ambulatory Visit (INDEPENDENT_AMBULATORY_CARE_PROVIDER_SITE_OTHER): Payer: Commercial Managed Care - PPO | Admitting: Family Medicine

## 2017-01-01 VITALS — BP 104/69 | HR 86 | Temp 98.2°F | Ht 64.0 in | Wt 154.4 lb

## 2017-01-01 DIAGNOSIS — R3129 Other microscopic hematuria: Secondary | ICD-10-CM

## 2017-01-01 DIAGNOSIS — M545 Low back pain: Secondary | ICD-10-CM | POA: Diagnosis not present

## 2017-01-01 DIAGNOSIS — R1031 Right lower quadrant pain: Secondary | ICD-10-CM

## 2017-01-01 LAB — COMPREHENSIVE METABOLIC PANEL
ALBUMIN: 4.2 g/dL (ref 3.5–5.2)
ALK PHOS: 42 U/L (ref 39–117)
ALT: 9 U/L (ref 0–35)
AST: 13 U/L (ref 0–37)
BUN: 8 mg/dL (ref 6–23)
CHLORIDE: 103 meq/L (ref 96–112)
CO2: 26 mEq/L (ref 19–32)
Calcium: 8.9 mg/dL (ref 8.4–10.5)
Creatinine, Ser: 0.77 mg/dL (ref 0.40–1.20)
GFR: 90.18 mL/min (ref >60.00)
Glucose, Bld: 87 mg/dL (ref 70–99)
POTASSIUM: 3.7 meq/L (ref 3.5–5.1)
SODIUM: 136 meq/L (ref 135–145)
TOTAL PROTEIN: 7.2 g/dL (ref 6.0–8.3)
Total Bilirubin: 0.4 mg/dL (ref 0.2–1.2)

## 2017-01-01 LAB — CBC
HEMATOCRIT: 36.6 % (ref 36.0–46.0)
HEMOGLOBIN: 12.3 g/dL (ref 12.0–15.0)
MCHC: 33.6 g/dL (ref 30.0–36.0)
MCV: 85.7 fl (ref 78.0–100.0)
Platelets: 279 10*3/uL (ref 150.0–400.0)
RBC: 4.28 Mil/uL (ref 3.87–5.11)
RDW: 15 % (ref 11.5–15.5)
WBC: 5.6 10*3/uL (ref 4.0–10.5)

## 2017-01-01 LAB — POC URINALSYSI DIPSTICK (AUTOMATED)
BILIRUBIN UA: NEGATIVE
GLUCOSE UA: NEGATIVE
Ketones, UA: NEGATIVE
Leukocytes, UA: NEGATIVE
Nitrite, UA: NEGATIVE
Protein, UA: NEGATIVE
RBC UA: NEGATIVE
Spec Grav, UA: 1.015
Urobilinogen, UA: NEGATIVE
pH, UA: 6

## 2017-01-01 LAB — POCT URINE PREGNANCY: PREG TEST UR: NEGATIVE

## 2017-01-01 NOTE — Progress Notes (Signed)
Pre visit review using our clinic review tool, if applicable. No additional management support is needed unless otherwise documented below in the visit note. 

## 2017-01-01 NOTE — Patient Instructions (Signed)
We will get some blood work for you today and send you urine for a culture. We will set you up for an ultrasound of your kidneys to look for any sign of swelling or an obstructive stone.   Keep a close eye on the mild pain in your abdomen- if this becomes more severe, constant, or concentrated in any one area of your belly, or if you have nausea, vomiting, fever or cannot eat go to the ER right away

## 2017-01-01 NOTE — Progress Notes (Addendum)
[place] [facility] [address] [place], [place] [address] [phone] [phone]  Date:  01/01/2017   Name:  Kathy Dorsey   DOB:  1980-12-17   MRN:  [id number]  PCP:  Lamar Blinks, MD    Chief Complaint: No chief complaint on file.   History of Present Illness:  Kathy Dorsey is a 36 y.o. very pleasant female patient who presents with the following:  Seen by myself in October- HPI from that visit:  Here today as a new patient- her last PCP left her practice so she is looking to establish care.   She is currently being evaluated for an illness which so far has not been dx- reports that she has seen neurology, endocrinology, rheumatology, cardiology in the last couple of years She has been dx with POTS, endometriosis, myoclonus, herniated cervical disc and thoracic arthritis  She brings a list of symptoms including "joint pains, swollen and painful nodes. Ulcers in her mouth and nose, hair loss, recurring chest pain when inhaling. 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 They are 47, 9 and 6 months old.   She just stopped nursing her infant a few weeks ago.  Her oldest son does have autism- he is high functioning and does well.   Her mom had DM 2,no other significant family history except her sister has a congenital heart condition and epilepsy.   She has had an extensive lab/ imaging evaluation.  She states that her neurologist wants to do a muscle biopsy.  She has had nerve conduction studies that may have showed a "mild slowing of the nerve conduction" She lives in [place]- would like to seek a second rheumatology opinion.  She has noted some anxiety and will sometimes feel overwhelmed with the new baby.  She is not really having crying jags. No SI or HI.  She is open to the idea of treatment for anxiety with an SSRI.  She states that  she does not feel that she is depressed however  We started her on prozac 20mg  at our last visit. She decided to not use this medication after all; she does feel like her anxiety has gotten better without medication.  She is working on yoga, meditation, and feels that her sx are improved.   Her children are all doing well- her youngest is 54 months old now.  He is with her today Here today to discuss a back problem and concern about a kidney stone About a month ago she saw rheum- was told that she had blood in her urine on what sounds like a dipstick only. Not gross hematuria. She is to come back for a follow-up test in 2 weeks.  She was not menstruating at this time. She did not have any sx at the time, but has now noted a pain in her right lower back- this has been present for about one week. The pain will come and go- it can be more severe at times.  She still does not have any gross hematuria.  She does not have any other urinary sx- no dysuria or frequency, no vaginal sx.  No nausea or vomiting, she is able to eat ok She also notes a vague abd pain that she just noted today- it is not severe and seems to be located in her lower belly.   Her SIL does have kidney  stones and wondered if this might be [name]'s current issue-  The pt herself has never had these however  She is coming on her menses today she thinks- some spotting yesterday  Patient Active Problem List   Diagnosis Date Noted  . Situational anxiety 09/28/2016  . Somatic symptom disorder 09/28/2016  . Active labor 03/05/2016  . SVD (spontaneous vaginal delivery) 03/05/2016  . Preterm labor in third trimester without delivery 03/04/2016    Past Medical History:  Diagnosis Date  . Arthritis   . Dysphagia   . POTS (postural orthostatic tachycardia syndrome)   . Shortness of breath dyspnea   . SVD (spontaneous vaginal delivery) 03/05/2016    Past Surgical History:  Procedure Laterality Date  . ABDOMINAL EXPLORATION SURGERY       Social History  Substance Use Topics  . Smoking status: Never Smoker  . Smokeless tobacco: Never Used  . Alcohol use No    Family History  Problem Relation Age of Onset  . Diabetes Mother   . Rheum arthritis    . Cancer Maternal Grandmother   . Diabetes Maternal Grandmother   . Diabetes Paternal Grandfather   . Cancer Paternal Grandfather   . Epilepsy Sister   . Heart Problems Sister     Allergies  Allergen Reactions  . Prednisone Palpitations and Other (See Comments)    Reaction:  Joint pain     Medication list has been reviewed and updated.  Current Outpatient Prescriptions on File Prior to Visit  Medication Sig Dispense Refill  . FLUoxetine (PROZAC) 20 MG tablet Take 1 tablet (20 mg total) by mouth daily. 30 tablet 6  . Multiple Vitamins-Minerals (WOMENS MULTIVITAMIN PO) Take 1 tablet by mouth daily.     No current facility-administered medications on file prior to visit.     Review of Systems:  As per HPI- otherwise negative.   Physical Examination: Blood pressure 104/69, pulse 86, temperature 98.2 F (36.8 C), temperature source Oral, height 5\' 4"  (1.626 m), weight 154 lb 6.4 oz (70 kg), last menstrual period 01/01/2017, SpO2 98 %, unknown if currently breastfeeding.  GEN: WDWN, NAD, Non-toxic, A & O x 3, looks well, normal weight HEENT: Atraumatic, Normocephalic. Neck supple. No masses, No LAD.  Bilateral TM wnl, oropharynx normal.  PEERL,EOMI.   Ears and Nose: No external deformity. CV: RRR, No M/G/R. No JVD. No thrill. No extra heart sounds. PULM: CTA B, no wheezes, crackles, rhonchi. No retractions. No resp. distress. No accessory muscle use. ABD: S, ND, +BS. No rebound. No HSM.  Pt notes a minimal tenderness over the suprapubic, RLQ area EXTR: No c/c/e NEURO Normal gait.  PSYCH: Normally interactive. Conversant. Not depressed or anxious appearing.  Calm demeanor.   Results for orders placed or performed in visit on 01/01/17  POCT Urinalysis  Dipstick (Automated)  Result Value Ref Range   Color, UA Yellow    Clarity, UA Clear    Glucose, UA Negaitve    Bilirubin, UA Negative    Ketones, UA Negative    Spec Grav, UA 1.015    Blood, UA Negaitve    pH, UA 6.0    Protein, UA Negative    Urobilinogen, UA negative    Nitrite, UA Negative    Leukocytes, UA Negative Negative  POCT urine pregnancy  Result Value Ref Range   Preg Test, Ur Negative Negative    Assessment and Plan: Right low back pain, unspecified chronicity, with sciatica presence unspecified - Plan: POCT Urinalysis Dipstick (Automated), CBC,  Comprehensive metabolic panel, POCT urine pregnancy, US Renal  Microhematuria - Plan: Urine culture  Right lower quadrant abdominal pain  Here today with concern of recent blood in urine and ?assoicated right lower back pain.  She is concerned about kidney stones.  Her sx are mild and doubt an obstucting stone- will try to avoid CT for now. Will get an [place] to look for hydronephrosis.  Also CMP and CBC pending.   She has a very mild RLQ tenderness- at this time again do not think a CT scan is indicated.  However went over return to care precautions with pt in detail.  She will seek care if any worsening of her discomfort.  Will also get a CBC today which may be helpful   Signed Lamar Blinks, MD Called pt and [facility]- labs are reassuring, await her [place] tomorrow  Results for orders placed or performed in visit on 01/01/17  CBC  Result Value Ref Range   WBC 5.6 4.0 - 10.5 K/uL   RBC 4.28 3.87 - 5.11 Mil/uL   Platelets 279.0 150.0 - 400.0 K/uL   Hemoglobin 12.3 12.0 - 15.0 g/dL   HCT 36.6 36.0 - 46.0 %   MCV 85.7 78.0 - 100.0 fl   MCHC 33.6 30.0 - 36.0 g/dL   RDW 15.0 11.5 - 15.5 %  Comprehensive metabolic panel  Result Value Ref Range   Sodium 136 135 - 145 mEq/L   Potassium 3.7 3.5 - 5.1 mEq/L   Chloride 103 96 - 112 mEq/L   CO2 26 19 - 32 mEq/L   Glucose, Bld 87 70 - 99 mg/dL   BUN 8 6 - 23 mg/dL   Creatinine,  Ser 0.77 0.40 - 1.20 mg/dL   Total Bilirubin 0.4 0.2 - 1.2 mg/dL   Alkaline Phosphatase 42 39 - 117 U/L   AST 13 0 - 37 U/L   ALT 9 0 - 35 U/L   Total Protein 7.2 6.0 - 8.3 g/dL   Albumin 4.2 3.5 - 5.2 g/dL   Calcium 8.9 8.4 - 10.5 mg/dL   GFR 90.18 >60.00 mL/min  POCT Urinalysis Dipstick (Automated)  Result Value Ref Range   Color, UA Yellow    Clarity, UA Clear    Glucose, UA Negaitve    Bilirubin, UA Negative    Ketones, UA Negative    Spec Grav, UA 1.015    Blood, UA Negaitve    pH, UA 6.0    Protein, UA Negative    Urobilinogen, UA negative    Nitrite, UA Negative    Leukocytes, UA Negative Negative  POCT urine pregnancy  Result Value Ref Range   Preg Test, Ur Negative Negative

## 2017-01-02 ENCOUNTER — Ambulatory Visit (HOSPITAL_BASED_OUTPATIENT_CLINIC_OR_DEPARTMENT_OTHER)
Admission: RE | Admit: 2017-01-02 | Discharge: 2017-01-02 | Disposition: A | Discharge status: Home / Self Care | Payer: Commercial Managed Care - PPO | Source: Ambulatory Visit | Attending: Family Medicine | Admitting: Family Medicine

## 2017-01-02 DIAGNOSIS — R3129 Other microscopic hematuria: Secondary | ICD-10-CM | POA: Insufficient documentation

## 2017-01-02 DIAGNOSIS — M545 Low back pain: Secondary | ICD-10-CM | POA: Insufficient documentation

## 2017-01-02 LAB — URINE CULTURE: Organism ID, Bacteria: NO GROWTH

## 2017-01-09 ENCOUNTER — Ambulatory Visit (INDEPENDENT_AMBULATORY_CARE_PROVIDER_SITE_OTHER): Payer: Commercial Managed Care - PPO | Admitting: Internal Medicine

## 2017-01-09 ENCOUNTER — Encounter: Payer: Self-pay | Admitting: Internal Medicine

## 2017-01-09 VITALS — BP 98/68 | HR 86 | Temp 98.2°F | Resp 16 | Wt 153.0 lb

## 2017-01-09 DIAGNOSIS — R059 Cough, unspecified: Secondary | ICD-10-CM

## 2017-01-09 DIAGNOSIS — Z20828 Contact with and (suspected) exposure to other viral communicable diseases: Secondary | ICD-10-CM

## 2017-01-09 DIAGNOSIS — R05 Cough: Secondary | ICD-10-CM | POA: Diagnosis not present

## 2017-01-09 DIAGNOSIS — F451 Undifferentiated somatoform disorder: Secondary | ICD-10-CM

## 2017-01-09 HISTORY — DX: Cough, unspecified: R05.9

## 2017-01-09 HISTORY — DX: Contact with and (suspected) exposure to other viral communicable diseases: Z20.828

## 2017-01-09 LAB — POCT INFLUENZA A/B

## 2017-01-09 MED ORDER — OSELTAMIVIR PHOSPHATE 75 MG PO CAPS: 75.0000 mg | ORAL_CAPSULE | Freq: Every day | ORAL | 0 refills | Status: DC

## 2017-01-09 NOTE — Progress Notes (Signed)
Pre visit review using our clinic review tool, if applicable. No additional management support is needed unless otherwise documented below in the visit note. 

## 2017-01-09 NOTE — Progress Notes (Signed)
Subjective:  Patient ID: Kathy Dorsey, female    DOB: 1981/10/22  Age: 35 y.o. MRN: [id number]  CC: Cough   HPI Kathy Dorsey presents for concerns about the acute onset this morning of mild sore throat, nonproductive cough, and runny nose. She has recently been exposed to influenza. She denies fever, chills, headache, myalgias, or fatigue.  Outpatient Medications Prior to Visit  Medication Sig Dispense Refill  . hydroxychloroquine (PLAQUENIL) 200 MG tablet Take 1 and a half tablets by mouth once daily.    . Multiple Vitamins-Minerals (WOMENS MULTIVITAMIN PO) Take 1 tablet by mouth daily.     No facility-administered medications prior to visit.     ROS Review of Systems  Constitutional: Negative for activity change, chills, diaphoresis, fatigue and fever.  HENT: Positive for rhinorrhea and sore throat. Negative for congestion, facial swelling, sinus pain, sinus pressure and trouble swallowing.   Respiratory: Positive for cough. Negative for chest tightness, shortness of breath, wheezing and stridor.   Cardiovascular: Negative for chest pain, palpitations and leg swelling.  Gastrointestinal: Negative for abdominal pain, constipation, diarrhea, nausea and vomiting.  Endocrine: Negative.   Genitourinary: Negative.   Musculoskeletal: Negative.  Negative for back pain and myalgias.  Skin: Negative.  Negative for color change and rash.  Allergic/Immunologic: Negative.   Neurological: Negative.  Negative for dizziness and weakness.  Hematological: Negative for adenopathy. Does not bruise/bleed easily.  Psychiatric/Behavioral: Negative.     Objective:  BP 98/68   Pulse 86   Temp 98.2 F (36.8 C) (Oral)   Resp 16   Wt 153 lb (69.4 kg)   LMP 01/01/2017 (Exact Date)   SpO2 96%   BMI 26.26 kg/m   BP Readings from Last 3 Encounters:  01/09/17 98/68  01/01/17 104/69  09/28/16 103/80    Wt Readings from Last 3 Encounters:  01/09/17 153 lb (69.4 kg)  01/01/17 154 lb 6.4 oz (70 kg)   09/28/16 154 lb 9.6 oz (70.1 kg)    Physical Exam  Constitutional: She is oriented to person, place, and time.  Non-toxic appearance. She does not have a sickly appearance. She does not appear ill. No distress.  HENT:  Mouth/Throat: Oropharynx is clear and moist. No oropharyngeal exudate.  Eyes: Conjunctivae are normal. Right eye exhibits no discharge. Left eye exhibits no discharge. No scleral icterus.  Neck: Normal range of motion. Neck supple. No JVD present. No tracheal deviation present. No thyromegaly present.  Cardiovascular: Normal rate, regular rhythm, normal heart sounds and intact distal pulses.  Exam reveals no gallop and no friction rub.   No murmur heard. Pulmonary/Chest: Effort normal and breath sounds normal. No stridor. No respiratory distress. She has no wheezes. She has no rales. She exhibits no tenderness.  Abdominal: Soft. Bowel sounds are normal. She exhibits no distension and no mass. There is no tenderness. There is no rebound and no guarding.  Musculoskeletal: She exhibits no edema, tenderness or deformity.  Lymphadenopathy:    She has no cervical adenopathy.  Neurological: She is oriented to person, place, and time.  Skin: Skin is warm and dry. No rash noted. She is not diaphoretic. No erythema. No pallor.  Vitals reviewed.   Lab Results  Component Value Date   WBC 5.6 01/01/2017   HGB 12.3 01/01/2017   HCT 36.6 01/01/2017   PLT 279.0 01/01/2017   GLUCOSE 87 01/01/2017   ALT 9 01/01/2017   AST 13 01/01/2017   NA 136 01/01/2017   K 3.7 01/01/2017   CL  103 01/01/2017   CREATININE 0.77 01/01/2017   BUN 8 01/01/2017   CO2 26 01/01/2017    US Renal  Result Date: 01/02/2017 CLINICAL DATA:  36 year old female with right flank pain and microhematuria for the past week. Initial encounter. EXAM: RENAL / URINARY TRACT ULTRASOUND COMPLETE COMPARISON:  01/29/2014 right upper quadrant ultrasound and 12/17/2012 renal sonogram. FINDINGS: Right Kidney: Length: 10.1  cm. Sonographer questioned minimal fluid in the renal pelvis however, this is probably normal for this patient and unchanged from remote exams. No mass identified. Echogenicity within normal limits. Left Kidney: Length: 10.6 cm. Echogenicity within normal limits. No mass or hydronephrosis visualized. Bladder: Appears normal for degree of bladder distention. Bilateral ureteral jets noted. IMPRESSION: No definitive evidence of hydronephrosis as noted above. Electronically Signed   By: Genia Del M.D.   On: 01/02/2017 13:12    Assessment & Plan:   Kathy Dorsey was seen today for cough.  Diagnoses and all orders for this visit:  Cough- screening for influenza A and B is negative. She has a mild viral URI and does not request anything for symptom relief. -     POCT Influenza A/B  Exposure to the flu- will treat with a 10 day course of Tamiflu -     oseltamivir (TAMIFLU) 75 MG capsule; Take 1 capsule (75 mg total) by mouth daily.  Somatic symptom disorder   I am having Kathy Dorsey start on oseltamivir. I am also having her maintain her Multiple Vitamins-Minerals (WOMENS MULTIVITAMIN PO) and hydroxychloroquine.  Meds ordered this encounter  Medications  . oseltamivir (TAMIFLU) 75 MG capsule    Sig: Take 1 capsule (75 mg total) by mouth daily.    Dispense:  10 capsule    Refill:  0     Follow-up: Return if symptoms worsen or fail to improve.  Kathy Calico, MD

## 2017-01-09 NOTE — Patient Instructions (Signed)
Cough, Adult Coughing is a reflex that clears your throat and your airways. Coughing helps to heal and protect your lungs. It is normal to cough occasionally, but a cough that happens with other symptoms or lasts a long time may be a sign of a condition that needs treatment. A cough may last only 2-3 weeks (acute), or it may last longer than 8 weeks (chronic). What are the causes? Coughing is commonly caused by:  Breathing in substances that irritate your lungs.  A viral or bacterial respiratory infection.  Allergies.  Asthma.  Postnasal drip.  Smoking.  Acid backing up from the stomach into the esophagus (gastroesophageal reflux).  Certain medicines.  Chronic lung problems, including COPD (or rarely, lung cancer).  Other medical conditions such as heart failure.  Follow these instructions at home: Pay attention to any changes in your symptoms. Take these actions to help with your discomfort:  Take medicines only as told by your health care provider. ? If you were prescribed an antibiotic medicine, take it as told by your health care provider. Do not stop taking the antibiotic even if you start to feel better. ? Talk with your health care provider before you take a cough suppressant medicine.  Drink enough fluid to keep your urine clear or pale yellow.  If the air is dry, use a cold steam vaporizer or humidifier in your bedroom or your home to help loosen secretions.  Avoid anything that causes you to cough at work or at home.  If your cough is worse at night, try sleeping in a semi-upright position.  Avoid cigarette smoke. If you smoke, quit smoking. If you need help quitting, ask your health care provider.  Avoid caffeine.  Avoid alcohol.  Rest as needed.  Contact a health care provider if:  You have new symptoms.  You cough up pus.  Your cough does not get better after 2-3 weeks, or your cough gets worse.  You cannot control your cough with suppressant  medicines and you are losing sleep.  You develop pain that is getting worse or pain that is not controlled with pain medicines.  You have a fever.  You have unexplained weight loss.  You have night sweats. Get help right away if:  You cough up blood.  You have difficulty breathing.  Your heartbeat is very fast. This information is not intended to replace advice given to you by your health care provider. Make sure you discuss any questions you have with your health care provider. Document Released: 05/26/2011 Document Revised: 05/04/2016 Document Reviewed: 02/03/2015 Elsevier Interactive Patient Education  2017 [employer]  

## 2017-01-22 DIAGNOSIS — R002 Palpitations: Secondary | ICD-10-CM | POA: Diagnosis not present

## 2017-01-22 DIAGNOSIS — I951 Orthostatic hypotension: Secondary | ICD-10-CM | POA: Diagnosis not present

## 2017-01-22 DIAGNOSIS — R000 Tachycardia, unspecified: Secondary | ICD-10-CM | POA: Diagnosis not present

## 2017-01-29 DIAGNOSIS — R002 Palpitations: Secondary | ICD-10-CM

## 2017-01-29 HISTORY — DX: Palpitations: R00.2

## 2017-02-16 DIAGNOSIS — J029 Acute pharyngitis, unspecified: Secondary | ICD-10-CM | POA: Diagnosis not present

## 2017-02-16 DIAGNOSIS — J019 Acute sinusitis, unspecified: Secondary | ICD-10-CM | POA: Diagnosis not present

## 2017-02-23 DIAGNOSIS — R102 Pelvic and perineal pain: Secondary | ICD-10-CM | POA: Diagnosis not present

## 2017-02-23 DIAGNOSIS — N940 Mittelschmerz: Secondary | ICD-10-CM | POA: Diagnosis not present

## 2017-02-23 DIAGNOSIS — N926 Irregular menstruation, unspecified: Secondary | ICD-10-CM | POA: Diagnosis not present

## 2017-03-16 DIAGNOSIS — I951 Orthostatic hypotension: Secondary | ICD-10-CM | POA: Diagnosis not present

## 2017-03-16 DIAGNOSIS — R000 Tachycardia, unspecified: Secondary | ICD-10-CM | POA: Diagnosis not present

## 2017-03-16 DIAGNOSIS — R002 Palpitations: Secondary | ICD-10-CM | POA: Diagnosis not present

## 2017-03-22 DIAGNOSIS — H52223 Regular astigmatism, bilateral: Secondary | ICD-10-CM | POA: Diagnosis not present

## 2017-04-29 DIAGNOSIS — J028 Acute pharyngitis due to other specified organisms: Secondary | ICD-10-CM | POA: Diagnosis not present

## 2017-04-29 DIAGNOSIS — B9789 Other viral agents as the cause of diseases classified elsewhere: Secondary | ICD-10-CM | POA: Diagnosis not present

## 2017-04-29 DIAGNOSIS — J029 Acute pharyngitis, unspecified: Secondary | ICD-10-CM | POA: Diagnosis not present

## 2017-07-03 ENCOUNTER — Telehealth: Payer: Self-pay | Admitting: Family Medicine

## 2017-07-03 NOTE — Telephone Encounter (Signed)
Patient Name: Kathy Dorsey  DOB: 1981/07/10    Initial Comment Caller states she is needing to schedule an appt. Symptoms: 4-5 days, sharp pain under left breast, coughed a few times, no fever, stomach discomfort, issues swallowing, wind pipe not closing off and chocking when she eats and drinks, and breathing hurts worse when she breathes.    Nurse Assessment  Nurse: Joline Salt, RN, Malachy Mood Date/Time Eilene Ghazi Time): 07/03/2017 12:05:53 PM  Confirm and document reason for call. If symptomatic, describe symptoms. ---Caller states she is needing to schedule an appt. Symptoms: 4-5 days, sharp pain under left breast, pain is worse when she breathes. No fever, stomach discomfort. No nausea, constipation or diarrhea. Just doesn't feel well after eating. For the last couple of weeks have had episodes where when eat or drink something she starts coughing as if she has choked.  Does the patient have any new or worsening symptoms? ---Yes  Will a triage be completed? ---Yes  Related visit to physician within the last 2 weeks? ---No  Does the PT have any chronic conditions? (i.e. diabetes, asthma, etc.) ---No  Is the patient pregnant or possibly pregnant? (Ask all females between the ages of 58-55) ---No  Is this a behavioral health or substance abuse call? ---No     Guidelines    Guideline Title Affirmed Question Affirmed Notes  Abdominal Pain - Upper [1] Pain lasts > 10 minutes AND [2] difficulty breathing    Final Disposition User   Go to ED Now Joline Salt, RN, [facility]    Referrals  [facility] - ED   Disagree/Comply: Comply

## 2017-07-04 NOTE — Telephone Encounter (Signed)
Follow up call made to patient . Left message for return call from patient.

## 2017-07-10 NOTE — Progress Notes (Deleted)
Kathy Dorsey at [facility] [address] [place], [place] [address] [phone]  Date:  07/12/2017   Name:  Kathy Dorsey   DOB:  [birth year]   MRN:  [id number]  PCP:  Darreld Mclean, MD    Chief Complaint: No chief complaint on file.   History of Present Illness:  Kathy Dorsey is a 36 y.o. very pleasant female patient who presents with the following:  History of anxiety 3 children at home- her youngest is now  Here today with concern of   Patient Active Problem List   Diagnosis Date Noted  . Cough 01/09/2017  . Exposure to the flu 01/09/2017  . Situational anxiety 09/28/2016    Past Medical History:  Diagnosis Date  . Arthritis   . Dysphagia   . POTS (postural orthostatic tachycardia syndrome)   . Shortness of breath dyspnea   . SVD (spontaneous vaginal delivery) 03/05/2016    Past Surgical History:  Procedure Laterality Date  . ABDOMINAL EXPLORATION SURGERY      Social History  Substance Use Topics  . Smoking status: Never Smoker  . Smokeless tobacco: Never Used  . Alcohol use No    Family History  Problem Relation Age of Onset  . Diabetes Mother   . Rheum arthritis Unknown   . Cancer Maternal Grandmother   . Diabetes Maternal Grandmother   . Diabetes Paternal Grandfather   . Cancer Paternal Grandfather   . Epilepsy Sister   . Heart Problems Sister     Allergies  Allergen Reactions  . Prednisone Palpitations and Other (See Comments)    Reaction:  Joint pain     Medication list has been reviewed and updated.  Current Outpatient Prescriptions on File Prior to Visit  Medication Sig Dispense Refill  . hydroxychloroquine (PLAQUENIL) 200 MG tablet Take 1 and a half tablets by mouth once daily.    . Multiple Vitamins-Minerals (WOMENS MULTIVITAMIN PO) Take 1 tablet by mouth daily.    Marland Kitchen oseltamivir (TAMIFLU) 75 MG capsule Take 1 capsule (75 mg total) by mouth daily. 10 capsule 0   No current  facility-administered medications on file prior to visit.     Review of Systems:  As per HPI- otherwise negative.   Physical Examination: There were no vitals filed for this visit. There were no vitals filed for this visit. There is no height or weight on file to calculate BMI. Ideal Body Weight:    GEN: WDWN, NAD, Non-toxic, A & O x 3 HEENT: Atraumatic, Normocephalic. Neck supple. No masses, No LAD. Ears and Nose: No external deformity. CV: RRR, No M/G/R. No JVD. No thrill. No extra heart sounds. PULM: CTA B, no wheezes, crackles, rhonchi. No retractions. No resp. distress. No accessory muscle use. ABD: S, NT, ND, +BS. No rebound. No HSM. EXTR: No c/c/e NEURO Normal gait.  PSYCH: Normally interactive. Conversant. Not depressed or anxious appearing.  Calm demeanor.    Assessment and Plan: ***  Signed Lamar Blinks, MD

## 2017-07-12 ENCOUNTER — Ambulatory Visit: Payer: Commercial Managed Care - PPO | Admitting: Family Medicine

## 2017-09-27 DIAGNOSIS — M25562 Pain in left knee: Secondary | ICD-10-CM | POA: Diagnosis not present

## 2017-09-27 DIAGNOSIS — S83422A Sprain of lateral collateral ligament of left knee, initial encounter: Secondary | ICD-10-CM | POA: Diagnosis not present

## 2017-10-17 DIAGNOSIS — L600 Ingrowing nail: Secondary | ICD-10-CM | POA: Diagnosis not present

## 2018-01-13 DIAGNOSIS — R509 Fever, unspecified: Secondary | ICD-10-CM | POA: Diagnosis not present

## 2018-01-13 DIAGNOSIS — J069 Acute upper respiratory infection, unspecified: Secondary | ICD-10-CM | POA: Diagnosis not present

## 2018-01-13 DIAGNOSIS — J029 Acute pharyngitis, unspecified: Secondary | ICD-10-CM | POA: Diagnosis not present

## 2018-04-10 ENCOUNTER — Encounter (HOSPITAL_BASED_OUTPATIENT_CLINIC_OR_DEPARTMENT_OTHER): Payer: Self-pay | Admitting: Respiratory Therapy

## 2018-04-10 ENCOUNTER — Other Ambulatory Visit: Payer: Self-pay

## 2018-04-10 ENCOUNTER — Emergency Department (HOSPITAL_BASED_OUTPATIENT_CLINIC_OR_DEPARTMENT_OTHER)
Admission: EM | Admit: 2018-04-10 | Discharge: 2018-04-11 | Disposition: A | Discharge status: Home / Self Care | Payer: Commercial Managed Care - PPO | Attending: Emergency Medicine | Admitting: Emergency Medicine

## 2018-04-10 DIAGNOSIS — E876 Hypokalemia: Secondary | ICD-10-CM | POA: Diagnosis not present

## 2018-04-10 DIAGNOSIS — R0789 Other chest pain: Secondary | ICD-10-CM | POA: Diagnosis present

## 2018-04-10 NOTE — ED Triage Notes (Signed)
C/o CP x today-NAD-steady gait

## 2018-04-11 ENCOUNTER — Ambulatory Visit: Payer: Commercial Managed Care - PPO | Admitting: Family Medicine

## 2018-04-11 ENCOUNTER — Emergency Department (HOSPITAL_BASED_OUTPATIENT_CLINIC_OR_DEPARTMENT_OTHER): Payer: Commercial Managed Care - PPO

## 2018-04-11 ENCOUNTER — Encounter: Payer: Self-pay | Admitting: Family Medicine

## 2018-04-11 VITALS — BP 102/70 | HR 87 | Temp 98.0°F | Ht 64.0 in | Wt 136.8 lb

## 2018-04-11 DIAGNOSIS — G90A Postural orthostatic tachycardia syndrome (POTS): Secondary | ICD-10-CM

## 2018-04-11 DIAGNOSIS — R102 Pelvic and perineal pain: Secondary | ICD-10-CM

## 2018-04-11 DIAGNOSIS — R000 Tachycardia, unspecified: Secondary | ICD-10-CM

## 2018-04-11 DIAGNOSIS — R0602 Shortness of breath: Secondary | ICD-10-CM | POA: Diagnosis not present

## 2018-04-11 DIAGNOSIS — I498 Other specified cardiac arrhythmias: Secondary | ICD-10-CM

## 2018-04-11 DIAGNOSIS — E876 Hypokalemia: Secondary | ICD-10-CM | POA: Diagnosis not present

## 2018-04-11 DIAGNOSIS — I951 Orthostatic hypotension: Secondary | ICD-10-CM

## 2018-04-11 DIAGNOSIS — R0789 Other chest pain: Secondary | ICD-10-CM | POA: Diagnosis not present

## 2018-04-11 LAB — CBC WITH DIFFERENTIAL/PLATELET
BASOS PCT: 0 %
Basophils Absolute: 0 10*3/uL (ref 0.0–0.1)
EOS ABS: 0.1 10*3/uL (ref 0.0–0.7)
Eosinophils Relative: 1 %
HCT: 35.2 % — ABNORMAL LOW (ref 36.0–46.0)
HEMOGLOBIN: 12.2 g/dL (ref 12.0–15.0)
Lymphocytes Relative: 27 %
Lymphs Abs: 1.7 10*3/uL (ref 0.7–4.0)
MCH: 30.3 pg (ref 26.0–34.0)
MCHC: 34.7 g/dL (ref 30.0–36.0)
MCV: 87.6 fL (ref 78.0–100.0)
Monocytes Absolute: 0.6 10*3/uL (ref 0.1–1.0)
Monocytes Relative: 9 %
NEUTROS PCT: 63 %
Neutro Abs: 4 10*3/uL (ref 1.7–7.7)
PLATELETS: 225 10*3/uL (ref 150–400)
RBC: 4.02 MIL/uL (ref 3.87–5.11)
RDW: 13.4 % (ref 11.5–15.5)
WBC: 6.4 10*3/uL (ref 4.0–10.5)

## 2018-04-11 LAB — BASIC METABOLIC PANEL
Anion gap: 8 (ref 5–15)
BUN: 14 mg/dL (ref 6–20)
CHLORIDE: 105 mmol/L (ref 101–111)
CO2: 24 mmol/L (ref 22–32)
CREATININE: 0.63 mg/dL (ref 0.44–1.00)
Calcium: 8.7 mg/dL — ABNORMAL LOW (ref 8.9–10.3)
GFR calc non Af Amer: >60 mL/min (ref >60)
Glucose, Bld: 143 mg/dL — ABNORMAL HIGH (ref 65–99)
POTASSIUM: 3.3 mmol/L — AB (ref 3.5–5.1)
SODIUM: 137 mmol/L (ref 135–145)

## 2018-04-11 LAB — POCT URINALYSIS DIP (MANUAL ENTRY)
BILIRUBIN UA: NEGATIVE
Glucose, UA: NEGATIVE mg/dL
Ketones, POC UA: NEGATIVE mg/dL
Leukocytes, UA: NEGATIVE
NITRITE UA: NEGATIVE
Protein Ur, POC: NEGATIVE mg/dL
RBC UA: NEGATIVE
Spec Grav, UA: 1.025 (ref 1.010–1.025)
UROBILINOGEN UA: 0.2 U/dL
pH, UA: 6 (ref 5.0–8.0)

## 2018-04-11 LAB — D-DIMER, QUANTITATIVE (NOT AT ARMC)

## 2018-04-11 LAB — TROPONIN I

## 2018-04-11 LAB — POCT URINE PREGNANCY: Preg Test, Ur: NEGATIVE

## 2018-04-11 MED ORDER — POTASSIUM CHLORIDE CRYS ER 20 MEQ PO TBCR
40.0000 meq | EXTENDED_RELEASE_TABLET | Freq: Once | ORAL | Status: AC
  Administered 2018-04-11: 40 meq via ORAL
  Filled 2018-04-11: qty 2

## 2018-04-11 NOTE — Progress Notes (Addendum)
[place] at [facility] [address], [place], [place] [address] [phone]  Date:  04/11/2018   Name:  Blaike Newburn   DOB:  04/19/1981   MRN:  [id number]  PCP:  Darreld Mclean, MD    Chief Complaint: Abdominal Pain (c/o sharp pain after eating, has also happened without eating, bloating, nausea, hiccups and fatigue. )   History of Present Illness:  Kadajah Kjos is a 37 y.o. very pleasant female patient who presents with the following:  Here today with her 74 yo son Woodfin Ganja She has noted some stomach sx for the last several days- approx one week She will note a sharp pain that will come and go quickly She has noted a poor appetite, and some nausea No vomiting She has felt full and bloated quickly when she eats Last night she went to the ER as her chest felt tight. - they evaluated and cleared her for the chest sx but not for belly pain  Of note she did have a benign BMP and CBC yesterday   Chemistry      Component Value Date/Time   NA 137 04/11/2018 0000   K 3.3 (L) 04/11/2018 0000   CL 105 04/11/2018 0000   CO2 24 04/11/2018 0000   BUN 14 04/11/2018 0000   CREATININE 0.63 04/11/2018 0000      Component Value Date/Time   CALCIUM 8.7 (L) 04/11/2018 0000   ALKPHOS 42 01/01/2017 1154   AST 13 01/01/2017 1154   ALT 9 01/01/2017 1154   BILITOT 0.4 01/01/2017 1154     Lab Results  Component Value Date   WBC 6.4 04/11/2018   HGB 12.2 04/11/2018   HCT 35.2 (L) 04/11/2018   MCV 87.6 04/11/2018   PLT 225 04/11/2018    She may notice the belly sx once an hour or so, may last 30 seconds or so After she will eat she has a lot of gurgling in her stomach, louder than is normal It is most pronounced in the left lower quadrant She is not constipated, no diarrhea No urinary sx except she had a little pain today just one time after she emptied her bladder  She is still eating but does not have have much appetitie  No possibility  of pregnancy- LMP was a couple of weeks ago She is not nursing any longer   She had a CBC and CMP yesterday   In 2015 she had an ex-lap and was noted to have endometriosis but per her report it was not severe  No other abd operations in the past  She is used to painful menses but this is different She has POTS and would like to change from [facility] cardiology to a local provider so she won't have to drive so far  Patient Active Problem List   Diagnosis Date Noted  . Cough 01/09/2017  . Exposure to the flu 01/09/2017  . Situational anxiety 09/28/2016    Past Medical History:  Diagnosis Date  . Arthritis   . Dysphagia   . POTS (postural orthostatic tachycardia syndrome)   . Shortness of breath dyspnea   . SVD (spontaneous vaginal delivery) 03/05/2016    Past Surgical History:  Procedure Laterality Date  . ABDOMINAL EXPLORATION SURGERY      Social History   Tobacco Use  . Smoking status: Never Smoker  . Smokeless tobacco: Never Used  Substance Use Topics  . Alcohol use: No  .  Drug use: No    Family History  Problem Relation Age of Onset  . Diabetes Mother   . Rheum arthritis Unknown   . Cancer Maternal Grandmother   . Diabetes Maternal Grandmother   . Diabetes Paternal Grandfather   . Cancer Paternal Grandfather   . Epilepsy Sister   . Heart Problems Sister     Allergies  Allergen Reactions  . Prednisone Palpitations and Other (See Comments)    Reaction:  Joint pain     Medication list has been reviewed and updated.  No current outpatient medications on file prior to visit.   No current facility-administered medications on file prior to visit.     Review of Systems:  As per HPI- otherwise negative. Never had any history of diverticulitis   Physical Examination: Vitals:   04/11/18 1647  BP: 102/70  Pulse: 87  Temp: 98 F (36.7 C)  SpO2: 97%   Vitals:   04/11/18 1647  Weight: 136 lb 12.8 oz (62.1 kg)  Height: 5\' 4"  (1.626 m)   Body mass index  is 23.48 kg/m. Ideal Body Weight: Weight in (lb) to have BMI = 25: 145.3  GEN: WDWN, NAD, Non-toxic, A & O x 3, normal weight, looks well  HEENT: Atraumatic, Normocephalic. Neck supple. No masses, No LAD.  Bilateral TM wnl, oropharynx normal.  PEERL,EOMI.   Ears and Nose: No external deformity. CV: RRR, No M/G/R. No JVD. No thrill. No extra heart sounds. PULM: CTA B, no wheezes, crackles, rhonchi. No retractions. No resp. distress. No accessory muscle use. ABD: S,  ND, +BS. No rebound. No HSM.  She notes mild tenderness over her abdomen in various places that seems to come and go, but is most consistent in the suprapubic area EXTR: No c/c/e NEURO Normal gait.  PSYCH: Normally interactive. Conversant. Not depressed or anxious appearing.  Calm demeanor.  Pelvic: normal, no vaginal lesions or discharge. Uterus normal, no CMT, no adnexal tendereness or masses    Results for orders placed or performed in visit on 04/11/18  POCT urinalysis dipstick  Result Value Ref Range   Color, UA yellow yellow   Clarity, UA clear clear   Glucose, UA negative negative mg/dL   Bilirubin, UA negative negative   Ketones, POC UA negative negative mg/dL   Spec Grav, UA 1.025 1.010 - 1.025   Blood, UA negative negative   pH, UA 6.0 5.0 - 8.0   Protein Ur, POC negative negative mg/dL   Urobilinogen, UA 0.2 0.2 or 1.0 E.U./dL   Nitrite, UA Negative Negative   Leukocytes, UA Negative Negative  POCT urine pregnancy  Result Value Ref Range   Preg Test, Ur Negative Negative    Assessment and Plan: Suprapubic pain - Plan: Urine Culture, POCT urinalysis dipstick, POCT urine pregnancy, US Pelvis Complete, US Transvaginal Non-OB  Adnexal pain - Plan: US Transvaginal Non-OB  POTS (postural orthostatic tachycardia syndrome) - Plan: Ambulatory referral to Cardiology  Here today with abd pains, on and off, for about one week. Recent normal CBC. Urine clear and HCG negative today.  Her clinical picture would most  suggest an ovarian cyst.  Will plan to get ultrasound for her tomorrow- offered to try and do today but she has her toddler with her so this is not really practical and she declines.  If [place] is non- revealing consider CT scan She will seek emergency care tonight if any worsening or change in her sx   Signed Lamar Blinks, MD  Received her  [place] 5/3, gave her a call.  LMOM that [place] is normal.  Will try her back  Dg Chest 2 View  Result Date: 04/11/2018 CLINICAL DATA:  Chest tightness and shortness of breath. EXAM: CHEST - 2 VIEW COMPARISON:  Chest radiograph 08/07/2014 FINDINGS: The heart size and mediastinal contours are within normal limits. Both lungs are clear. The visualized skeletal structures are unremarkable. IMPRESSION: No active cardiopulmonary disease. Electronically Signed   By: Ulyses Jarred M.D.   On: 04/11/2018 00:40   US Transvaginal Non-ob  Result Date: 04/12/2018 CLINICAL DATA:  Lower abdominal pain EXAM: TRANSABDOMINAL AND TRANSVAGINAL ULTRASOUND OF PELVIS TECHNIQUE: Both transabdominal and transvaginal ultrasound examinations of the pelvis were performed. Transabdominal technique was performed for global imaging of the pelvis including uterus, ovaries, adnexal regions, and pelvic cul-de-sac. It was necessary to proceed with endovaginal exam following the transabdominal exam to visualize the uterus, endometrium, ovaries and adnexa. COMPARISON:  08/17/2015 FINDINGS: Uterus Measurements: 9.7 x 4.9 x 5.3 cm. Small complex cystic appearing area in the lower uterine segment in a submucosal location with diffuse internal echoes in septation. This measures 2.4 x 2.1 x 1.4 cm. Endometrium Thickness: 12 mm in thickness.  No focal abnormality visualized. Right ovary Measurements: 2.3 x 2.2 x 3.1 cm. Normal appearance/no adnexal mass. Left ovary Measurements: 3.2 x 1.9 x 2.4 cm. Normal appearance/no adnexal mass. Other findings No abnormal free fluid. IMPRESSION: Small complex cystic-appearing area  within the submucosal region of the lower uterine segment measuring up to 2.4 cm. This may reflect cystic degeneration of a small fibroid. This could be followed with repeat ultrasound in 6-12 months to ensure stability. Electronically Signed   By: Rolm Baptise M.D.   On: 04/12/2018 10:14   US Pelvis Complete  Result Date: 04/12/2018 CLINICAL DATA:  Lower abdominal pain EXAM: TRANSABDOMINAL AND TRANSVAGINAL ULTRASOUND OF PELVIS TECHNIQUE: Both transabdominal and transvaginal ultrasound examinations of the pelvis were performed. Transabdominal technique was performed for global imaging of the pelvis including uterus, ovaries, adnexal regions, and pelvic cul-de-sac. It was necessary to proceed with endovaginal exam following the transabdominal exam to visualize the uterus, endometrium, ovaries and adnexa. COMPARISON:  08/17/2015 FINDINGS: Uterus Measurements: 9.7 x 4.9 x 5.3 cm. Small complex cystic appearing area in the lower uterine segment in a submucosal location with diffuse internal echoes in septation. This measures 2.4 x 2.1 x 1.4 cm. Endometrium Thickness: 12 mm in thickness.  No focal abnormality visualized. Right ovary Measurements: 2.3 x 2.2 x 3.1 cm. Normal appearance/no adnexal mass. Left ovary Measurements: 3.2 x 1.9 x 2.4 cm. Normal appearance/no adnexal mass. Other findings No abnormal free fluid. IMPRESSION: Small complex cystic-appearing area within the submucosal region of the lower uterine segment measuring up to 2.4 cm. This may reflect cystic degeneration of a small fibroid. This could be followed with repeat ultrasound in 6-12 months to ensure stability. Electronically Signed   By: Rolm Baptise M.D.   On: 04/12/2018 10:14    Called again but did not reach again.  I had received the following message from my nurse:  Hi Dr. Lorelei Pont,   This pt called in to get lab results. I noticed the urine culture did not go out. I believe someone else ran the UA and HCG test for me yesterday but I  didn't realize the urine needed to also go out for a culture. The urine was still in the bathroom. I asked the lab if the urine has been sitting out too long for [place] to  still send the urine culture and I was told that it was so I need to have the patient come back to leave a urine sample.   Called to see if patient could stop by to leave urine today but she is unable to come today but can come on Monday. I did not go over the results with the patient because I was not sure If you were waiting on the urine culture result to come back first.   The patient also wanted to let you know that she does want to have the CT scan done, and would like a referral to cardiology due to viewing her recent EKG result on Mychart. Please advise.   Called and [facility] for patient- I have already referred her to cardiology and will set up CT as requested   Called pt and did discuss with her on 5/3- she understands that we are going ahead with CT- however this likely won't get done until Monday. If she is having severe pain or is not ok in the meantime please go to the ER  Also [facility] for pt on 5/3- my suspicion for diverticulitis is relatively low. However, if  She likes I can start her on abx for this and she can also consider a low residue diet.  Please let me know if any questions

## 2018-04-11 NOTE — ED Provider Notes (Addendum)
[place] DEPT [facility] Provider Note: Kathy Spurling, MD, FACEP  CSN: [id number] MRN: [id number] ARRIVAL: 04/10/18 at 2246 ROOM: [place]  Chest Pain   HISTORY OF PRESENT ILLNESS  04/11/18 12:12 AM Kathy Dorsey is a 37 y.o. female with a history of p.o. TS who frequently has tachycardia when up and active.  This tachycardia usually improves when sitting or lying at rest.  She is here with a discomfort in her chest since yesterday morning.  She describes the discomfort as a tightness.  It is varied in severity from mild to moderate.  There is nothing that makes it better or worse.  She has had some equivocal shortness of breath but none presently.  She has also had some fleeting, stabbing pains in the center of her chest into her back and left side.  She has had some intermittent nausea for several days but thinks that may be unrelated.  She denies nausea at the present time.  She has had no diaphoresis.  She has had no swelling in her legs.  She denies recent travel.  She is not on estrogen.  She does not smoke.    Past Medical History:  Diagnosis Date  . Arthritis   . Dysphagia   . POTS (postural orthostatic tachycardia syndrome)   . Shortness of breath dyspnea   . SVD (spontaneous vaginal delivery) 03/05/2016    Past Surgical History:  Procedure Laterality Date  . ABDOMINAL EXPLORATION SURGERY      Family History  Problem Relation Age of Onset  . Diabetes Mother   . Rheum arthritis Unknown   . Cancer Maternal Grandmother   . Diabetes Maternal Grandmother   . Diabetes Paternal Grandfather   . Cancer Paternal Grandfather   . Epilepsy Sister   . Heart Problems Sister     Social History   Tobacco Use  . Smoking status: Never Smoker  . Smokeless tobacco: Never Used  Substance Use Topics  . Alcohol use: No  . Drug use: No    Prior to Admission medications   Not on File    Allergies Prednisone   REVIEW OF SYSTEMS  Negative except as noted  here or in the History of Present Illness.   PHYSICAL EXAMINATION  Initial Vital Signs Blood pressure 101/69, pulse 97, temperature 98.6 F (37 C), temperature source Oral, resp. rate 11, height 5\' 4"  (1.626 m), weight 62.7 kg (138 lb 3.7 oz), last menstrual period 03/26/2018, SpO2 100 %, unknown if currently breastfeeding.  Examination General: Well-developed, well-nourished female in no acute distress; appearance consistent with age of record HENT: normocephalic; atraumatic Eyes: pupils equal, round and reactive to light; extraocular muscles intact Neck: supple Heart: regular rate and rhythm; tachycardia 100-130s Lungs: clear to auscultation bilaterally Abdomen: soft; nondistended; nontender; no masses or hepatosplenomegaly; bowel sounds present Extremities: No deformity; full range of motion; pulses normal; no edema Neurologic: Awake, alert and oriented; motor function intact in all extremities and symmetric; no facial droop Skin: Warm and dry Psychiatric: Normal mood and affect   RESULTS  Summary of this visit's results, reviewed by myself:   EKG Interpretation  Date/Time:  Wednesday Apr 10 2018 22:53:41 EDT Ventricular Rate:  123 PR Interval:  126 QRS Duration: 94 QT Interval:  330 QTC Calculation: 472 R Axis:   57 Text Interpretation:  Sinus tachycardia Abnormal ECG No previous ECGs available Confirmed by Kathy Dorsey [id number]) on 04/10/2018 10:56:55 PM      Laboratory Studies: Results for orders  placed or performed during the hospital encounter of 04/10/18 (from the past 24 hour(s))  CBC with Differential/Platelet     Status: Abnormal   Collection Time: 04/11/18 12:00 AM  Result Value Ref Range   WBC 6.4 4.0 - 10.5 K/uL   RBC 4.02 3.87 - 5.11 MIL/uL   Hemoglobin 12.2 12.0 - 15.0 g/dL   HCT 35.2 (L) 36.0 - 46.0 %   MCV 87.6 78.0 - 100.0 fL   MCH 30.3 26.0 - 34.0 pg   MCHC 34.7 30.0 - 36.0 g/dL   RDW 13.4 11.5 - 15.5 %   Platelets 225 150 - 400 K/uL   Neutrophils  Relative % 63 %   Neutro Abs 4.0 1.7 - 7.7 K/uL   Lymphocytes Relative 27 %   Lymphs Abs 1.7 0.7 - 4.0 K/uL   Monocytes Relative 9 %   Monocytes Absolute 0.6 0.1 - 1.0 K/uL   Eosinophils Relative 1 %   Eosinophils Absolute 0.1 0.0 - 0.7 K/uL   Basophils Relative 0 %   Basophils Absolute 0.0 0.0 - 0.1 K/uL  Basic metabolic panel     Status: Abnormal   Collection Time: 04/11/18 12:00 AM  Result Value Ref Range   Sodium 137 135 - 145 mmol/L   Potassium 3.3 (L) 3.5 - 5.1 mmol/L   Chloride 105 101 - 111 mmol/L   CO2 24 22 - 32 mmol/L   Glucose, Bld 143 (H) 65 - 99 mg/dL   BUN 14 6 - 20 mg/dL   Creatinine, Ser 0.63 0.44 - 1.00 mg/dL   Calcium 8.7 (L) 8.9 - 10.3 mg/dL   GFR calc non Af Amer >60 >60 mL/min   GFR calc Af Amer >60 >60 mL/min   Anion gap 8 5 - 15  Troponin I     Status: None   Collection Time: 04/11/18 12:00 AM  Result Value Ref Range   Troponin I <0.03 <0.03 ng/mL  D-dimer, quantitative (not at [facility])     Status: None   Collection Time: 04/11/18 12:00 AM  Result Value Ref Range   D-Dimer, Quant <0.27 0.00 - 0.50 ug/mL-FEU   Imaging Studies: Dg Chest 2 View  Result Date: 04/11/2018 CLINICAL DATA:  Chest tightness and shortness of breath. EXAM: CHEST - 2 VIEW COMPARISON:  Chest radiograph 08/07/2014 FINDINGS: The heart size and mediastinal contours are within normal limits. Both lungs are clear. The visualized skeletal structures are unremarkable. IMPRESSION: No active cardiopulmonary disease. Electronically Signed   By: Ulyses Jarred M.D.   On: 04/11/2018 00:40    ED COURSE and MDM  Nursing notes and initial vitals signs, including pulse oximetry, reviewed.  Vitals:   04/10/18 2252 04/10/18 2342 04/11/18 0000 04/11/18 0038  BP: 128/89 101/69 101/68 (!) 92/49  Pulse: (!) 116 97 86   Resp: 18 11 13    Temp: 98.6 F (37 C)     TempSrc: Oral     SpO2: 100% 100% 100% 99%  Weight: 62.7 kg (138 lb 3.7 oz)     Height: 5\' 4"  (1.626 m)      1:08 AM The patient's  diagnostic studies are reassuring.  She is still somewhat symptomatic but less so than earlier.  She does continue to have tachycardia but as noted she has known POTS.  Her potassium is slightly low and we will supplement this.  She has an appointment later today with her primary care physician, Dr. Janett Billow Copland.   PROCEDURES    ED DIAGNOSES  ICD-10-CM   1. Atypical chest pain R07.89   2. Hypokalemia E87.6        Kathy Dorsey, Kathy Reichmann, MD 04/11/18 0110    Kathy Rosser, MD 04/11/18 0111

## 2018-04-11 NOTE — Patient Instructions (Signed)
Good to see you today- we will set you up for an ultrasound tomorrow  If anything is not ok tonight go back to the ER

## 2018-04-12 ENCOUNTER — Ambulatory Visit (HOSPITAL_BASED_OUTPATIENT_CLINIC_OR_DEPARTMENT_OTHER)
Admission: RE | Admit: 2018-04-12 | Discharge: 2018-04-12 | Disposition: A | Discharge status: Home / Self Care | Payer: Commercial Managed Care - PPO | Source: Ambulatory Visit | Attending: Family Medicine | Admitting: Family Medicine

## 2018-04-12 ENCOUNTER — Telehealth: Payer: Self-pay | Admitting: Family Medicine

## 2018-04-12 ENCOUNTER — Encounter (HOSPITAL_BASED_OUTPATIENT_CLINIC_OR_DEPARTMENT_OTHER): Payer: Self-pay

## 2018-04-12 ENCOUNTER — Encounter: Payer: Self-pay | Admitting: Family Medicine

## 2018-04-12 DIAGNOSIS — R102 Pelvic and perineal pain: Secondary | ICD-10-CM | POA: Diagnosis present

## 2018-04-12 DIAGNOSIS — N858 Other specified noninflammatory disorders of uterus: Secondary | ICD-10-CM | POA: Diagnosis not present

## 2018-04-12 DIAGNOSIS — R9389 Abnormal findings on diagnostic imaging of other specified body structures: Secondary | ICD-10-CM | POA: Diagnosis not present

## 2018-04-12 NOTE — Addendum Note (Signed)
Addended by: Lamar Blinks C on: 04/12/2018 03:12 PM   Modules accepted: Orders

## 2018-04-12 NOTE — Telephone Encounter (Signed)
Copied from [place] [id number]. Topic: Quick Communication - Lab Results >> Apr 12, 2018  1:33 PM Robina Ade, Helene Kelp D wrote: Patient called to get her lab results. Please call patient back, thanks.

## 2018-04-12 NOTE — Telephone Encounter (Signed)
Pt saw Dr. Lorelei Pont on 04/11/18 urine culture was order but not sent. Pt notified and will come in on 04/15/18 just to leave another urine sample.

## 2018-04-15 ENCOUNTER — Other Ambulatory Visit: Payer: Commercial Managed Care - PPO

## 2018-04-15 ENCOUNTER — Other Ambulatory Visit: Payer: Self-pay | Admitting: Emergency Medicine

## 2018-04-15 DIAGNOSIS — R102 Pelvic and perineal pain: Secondary | ICD-10-CM

## 2018-04-16 ENCOUNTER — Ambulatory Visit (HOSPITAL_BASED_OUTPATIENT_CLINIC_OR_DEPARTMENT_OTHER)
Admission: RE | Admit: 2018-04-16 | Discharge: 2018-04-16 | Disposition: A | Discharge status: Home / Self Care | Payer: Commercial Managed Care - PPO | Source: Ambulatory Visit | Attending: Family Medicine | Admitting: Family Medicine

## 2018-04-16 ENCOUNTER — Encounter: Payer: Self-pay | Admitting: Family Medicine

## 2018-04-16 DIAGNOSIS — R102 Pelvic and perineal pain: Secondary | ICD-10-CM | POA: Insufficient documentation

## 2018-04-16 DIAGNOSIS — N858 Other specified noninflammatory disorders of uterus: Secondary | ICD-10-CM | POA: Diagnosis not present

## 2018-04-16 DIAGNOSIS — R1031 Right lower quadrant pain: Secondary | ICD-10-CM

## 2018-04-16 DIAGNOSIS — R109 Unspecified abdominal pain: Secondary | ICD-10-CM | POA: Diagnosis not present

## 2018-04-16 MED ORDER — IOPAMIDOL (ISOVUE-300) INJECTION 61%
100.0000 mL | Freq: Once | INTRAVENOUS | Status: AC | PRN
  Administered 2018-04-16: 100 mL via INTRAVENOUS

## 2018-04-18 ENCOUNTER — Encounter: Payer: Self-pay | Admitting: Gastroenterology

## 2018-04-18 DIAGNOSIS — D219 Benign neoplasm of connective and other soft tissue, unspecified: Secondary | ICD-10-CM

## 2018-04-18 DIAGNOSIS — N809 Endometriosis, unspecified: Secondary | ICD-10-CM

## 2018-04-18 DIAGNOSIS — G253 Myoclonus: Secondary | ICD-10-CM | POA: Insufficient documentation

## 2018-04-18 HISTORY — DX: Myoclonus: G25.3

## 2018-04-18 HISTORY — DX: Benign neoplasm of connective and other soft tissue, unspecified: D21.9

## 2018-04-18 HISTORY — DX: Endometriosis, unspecified: N80.9

## 2018-04-19 ENCOUNTER — Ambulatory Visit: Payer: Commercial Managed Care - PPO | Admitting: Cardiology

## 2018-04-19 ENCOUNTER — Encounter: Payer: Self-pay | Admitting: Cardiology

## 2018-04-19 VITALS — BP 110/64 | HR 104 | Ht 64.0 in | Wt 137.0 lb

## 2018-04-19 DIAGNOSIS — R000 Tachycardia, unspecified: Secondary | ICD-10-CM | POA: Diagnosis not present

## 2018-04-19 DIAGNOSIS — R002 Palpitations: Secondary | ICD-10-CM

## 2018-04-19 DIAGNOSIS — R0789 Other chest pain: Secondary | ICD-10-CM

## 2018-04-19 DIAGNOSIS — I951 Orthostatic hypotension: Secondary | ICD-10-CM

## 2018-04-19 DIAGNOSIS — R079 Chest pain, unspecified: Secondary | ICD-10-CM | POA: Diagnosis not present

## 2018-04-19 DIAGNOSIS — I498 Other specified cardiac arrhythmias: Secondary | ICD-10-CM

## 2018-04-19 DIAGNOSIS — G90A Postural orthostatic tachycardia syndrome (POTS): Secondary | ICD-10-CM

## 2018-04-19 NOTE — Progress Notes (Signed)
Cardiology Consultation:    Date:  04/19/2018   ID:  Kathy Dorsey, DOB 1981-06-12, MRN [id number]  PCP:  Darreld Mclean, MD  Cardiologist:  Jenne Campus, MD   Referring MD: Darreld Mclean, MD   Chief Complaint  Patient presents with  . Chest Pain    Has history of Pots  . Tachycardia    uanble to get regulated  I have chest pain and tachycardia  History of Present Illness:    Kathy Dorsey is a 37 y.o. female who is being seen today for the evaluation of chest pain at the request of Copland, Gay Filler, MD.  For last few weeks she is being experiencing chest pain.  The 2 type of chest pain she has one is sharp stabbing lasting only several seconds not related to exercise second type of pain she had actually once which was pressure heavy sensation lasting for hours straight taking deep breath coughing did not make any difference she went to the emergency room she was evaluated for PE as well as MI all and negative and she was sent home.  Since that time she does not have any major issues she goes to gym on the regular basis 3 times a week.  Few years ago she was diagnosed with postural orthostatic tachycardia syndrome.  She was advised to take more salt and water which she does she also goes to gym and do some interval training which also seems to be helping.  Still describe situations that her heart will speed up with no particular reason.  But again the main reason why she is here in our office is talk about chest pain.  She does not have any risk factors for coronary artery disease.  She is not sure what her cholesterol is.  Past Medical History:  Diagnosis Date  . Arthritis   . Cough 01/09/2017  . Dysphagia   . Endometriosis 04/18/2018  . Exposure to the flu 01/09/2017  . Fibroids 04/18/2018  . Jerking movements of extremities 04/15/2015  . Maternal age 63+, multigravida, antepartum 02/18/2016  . Myalgia 04/15/2015  . Myoclonic disorder 04/18/2018  . Numbness and tingling of both  legs 04/15/2015  . Palpitations 01/29/2017  . POTS (postural orthostatic tachycardia syndrome)   . Shortness of breath dyspnea   . Situational anxiety 09/28/2016  . SVD (spontaneous vaginal delivery) 03/05/2016    Past Surgical History:  Procedure Laterality Date  . ABDOMINAL EXPLORATION SURGERY      Current Medications: No outpatient medications have been marked as taking for the 04/19/18 encounter (Office Visit) with Park Liter, MD.     Allergies:   Prednisone   Social History   Socioeconomic History  . Marital status: Married    Spouse name: Not on file  . Number of children: Not on file  . Years of education: Not on file  . Highest education level: Not on file  Occupational History  . Not on file  Social Needs  . Financial resource strain: Not on file  . Food insecurity:    Worry: Not on file    Inability: Not on file  . Transportation needs:    Medical: Not on file    Non-medical: Not on file  Tobacco Use  . Smoking status: Never Smoker  . Smokeless tobacco: Never Used  Substance and Sexual Activity  . Alcohol use: No  . Drug use: No  . Sexual activity: Yes    Birth control/protection: Condom  Lifestyle  .  Physical activity:    Days per week: Not on file    Minutes per session: Not on file  . Stress: Not on file  Relationships  . Social connections:    Talks on phone: Not on file    Gets together: Not on file    Attends religious service: Not on file    Active member of club or organization: Not on file    Attends meetings of clubs or organizations: Not on file    Relationship status: Not on file  Other Topics Concern  . Not on file  Social History Narrative   Lives with husband and 2 sons.  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     Family History: The patient's family history includes Cancer in her maternal grandmother and paternal grandfather; Diabetes in her maternal grandmother, mother, and paternal grandfather; Epilepsy in her sister;  Heart Problems in her sister; Rheum arthritis in her unknown relative. ROS:   Please see the history of present illness.    All 14 point review of systems negative except as described per history of present illness.  EKGs/Labs/Other Studies Reviewed:    The following studies were reviewed today:   EKG:  EKG is  ordered today.  The ekg ordered today demonstrates normal sinus rhythm normal P interval normal QS complex duration morphology  Recent Labs: 04/11/2018: BUN 14; Creatinine, Ser 0.63; Hemoglobin 12.2; Platelets 225; Potassium 3.3; Sodium 137  Recent Lipid Panel No results found for: CHOL, TRIG, HDL, CHOLHDL, VLDL, LDLCALC, LDLDIRECT  Physical Exam:    VS:  BP 110/64 (BP Location: Left Arm)   Pulse (!) 104   Ht 5\' 4"  (1.626 m)   Wt 137 lb (62.1 kg)   LMP 03/26/2018   SpO2 99%   BMI 23.52 kg/m     Wt Readings from Last 3 Encounters:  04/19/18 137 lb (62.1 kg)  04/11/18 136 lb 12.8 oz (62.1 kg)  04/10/18 138 lb 3.7 oz (62.7 kg)     GEN:  Well nourished, well developed in no acute distress HEENT: Normal NECK: No JVD; No carotid bruits LYMPHATICS: No lymphadenopathy CARDIAC: RRR, no murmurs, no rubs, no gallops RESPIRATORY:  Clear to auscultation without rales, wheezing or rhonchi  ABDOMEN: Soft, non-tender, non-distended MUSCULOSKELETAL:  No edema; No deformity  SKIN: Warm and dry NEUROLOGIC:  Alert and oriented x 3 PSYCHIATRIC:  Normal affect   ASSESSMENT:    1. Chest pain, unspecified type   2. Tachycardia   3. Atypical chest pain   4. POTS (postural orthostatic tachycardia syndrome)   5. Palpitations    PLAN:    In order of problems listed above:  1. Chest pain very nonspecific in this lady with low risk for coronary artery disease however I think it will be still reasonable to perform stress echocardiogram to rule out any ischemia. 2. Tachycardia will be nice to do a stress test to see heart rate variability and chronotropic response.  On top of that  obviously will rule potential ischemia which I have read a low level suspicion 3. Postural orthostatic tachycardia syndrome: Managed appropriately will continue.   Medication Adjustments/Labs and Tests Ordered: Current medicines are reviewed at length with the patient today.  Concerns regarding medicines are outlined above.  Orders Placed This Encounter  Procedures  . EKG 12-Lead  . ECHOCARDIOGRAM STRESS TEST   No orders of the defined types were placed in this encounter.   Signed, Park Liter, MD, [facility]. 04/19/2018 5:17 PM  [facility]

## 2018-04-19 NOTE — Patient Instructions (Signed)
Medication Instructions:  Your physician recommends that you continue on your current medications as directed. Please refer to the Current Medication list given to you today.   Labwork: None  Testing/Procedures: You had an EKG today.   Your physician has requested that you have a stress echocardiogram. For further information please visit [email or web address]. Please follow instruction sheet as given.   Follow-Up: Your physician recommends that you schedule a follow-up appointment in: 1 month.  Any Other Special Instructions Will Be Listed Below (If Applicable).     If you need a refill on your cardiac medications before your next appointment, please call your pharmacy.

## 2018-05-08 ENCOUNTER — Ambulatory Visit (HOSPITAL_BASED_OUTPATIENT_CLINIC_OR_DEPARTMENT_OTHER)
Admission: RE | Admit: 2018-05-08 | Discharge: 2018-05-08 | Disposition: A | Discharge status: Home / Self Care | Payer: Commercial Managed Care - PPO | Source: Ambulatory Visit | Attending: Cardiology | Admitting: Cardiology

## 2018-05-08 DIAGNOSIS — R000 Tachycardia, unspecified: Secondary | ICD-10-CM

## 2018-05-08 DIAGNOSIS — R079 Chest pain, unspecified: Secondary | ICD-10-CM

## 2018-05-08 NOTE — Progress Notes (Signed)
Echocardiogram Echocardiogram Stress Test with limited echo has been performed.  Joelene Millin 05/08/2018, 3:58 PM

## 2018-05-13 ENCOUNTER — Encounter: Payer: Self-pay | Admitting: Family Medicine

## 2018-05-27 ENCOUNTER — Ambulatory Visit: Payer: Commercial Managed Care - PPO | Admitting: Cardiology

## 2018-05-28 ENCOUNTER — Ambulatory Visit (INDEPENDENT_AMBULATORY_CARE_PROVIDER_SITE_OTHER): Payer: Commercial Managed Care - PPO | Admitting: Gastroenterology

## 2018-05-28 ENCOUNTER — Encounter: Payer: Self-pay | Admitting: Gastroenterology

## 2018-05-28 ENCOUNTER — Other Ambulatory Visit (INDEPENDENT_AMBULATORY_CARE_PROVIDER_SITE_OTHER): Payer: Commercial Managed Care - PPO

## 2018-05-28 VITALS — BP 110/72 | HR 86 | Ht 64.0 in | Wt 138.4 lb

## 2018-05-28 DIAGNOSIS — R1011 Right upper quadrant pain: Secondary | ICD-10-CM | POA: Diagnosis not present

## 2018-05-28 DIAGNOSIS — R0789 Other chest pain: Secondary | ICD-10-CM

## 2018-05-28 LAB — COMPREHENSIVE METABOLIC PANEL
ALK PHOS: 42 U/L (ref 39–117)
ALT: 9 U/L (ref 0–35)
AST: 11 U/L (ref 0–37)
Albumin: 4.3 g/dL (ref 3.5–5.2)
BUN: 10 mg/dL (ref 6–23)
CO2: 30 mEq/L (ref 19–32)
CREATININE: 0.84 mg/dL (ref 0.40–1.20)
Calcium: 9.2 mg/dL (ref 8.4–10.5)
Chloride: 105 mEq/L (ref 96–112)
GFR: 80.93 mL/min (ref >60.00)
Glucose, Bld: 92 mg/dL (ref 70–99)
Potassium: 4.4 mEq/L (ref 3.5–5.1)
SODIUM: 140 meq/L (ref 135–145)
TOTAL PROTEIN: 6.8 g/dL (ref 6.0–8.3)
Total Bilirubin: 0.4 mg/dL (ref 0.2–1.2)

## 2018-05-28 NOTE — Patient Instructions (Signed)
If you are age 37 or older, your body mass index should be between 23-30. Your Body mass index is 23.75 kg/m. If this is out of the aforementioned range listed, please consider follow up with your Primary Care Provider.  If you are age 6 or younger, your body mass index should be between 19-25. Your Body mass index is 23.75 kg/m. If this is out of the aformentioned range listed, please consider follow up with your Primary Care Provider.   You have been scheduled for an endoscopy. Please follow written instructions given to you at your visit today. If you use inhalers (even only as needed), please bring them with you on the day of your procedure. Your physician has requested that you go to www.startemmi.com and enter the access code given to you at your visit today. This web site gives a general overview about your procedure. However, you should still follow specific instructions given to you by our office regarding your preparation for the procedure.   Please stop at the lab before you leave the office today.

## 2018-05-28 NOTE — Progress Notes (Signed)
Chief Complaint: RUQ pain  Referring Provider:  Darreld Mclean, MD      ASSESSMENT AND PLAN;   #1. RUQ abdominal pain (neg CT 04/16/2018, negative ultrasound 03/2013) #2. Non Cardiac Chest Pain with occ dysphagia, negative cardiac evaluation by Dr. Raliegh Ip. Ne barium swallow 11/2011  Plan: - EGD with possible SB Bx and dilatation - Check celiac screen and CMP today. - HIDA with EF if still with problems. -Trial of PPIs thereafter if needed.  Reviewed CT scan of the abdomen pelvis with the patient and have personally reviewed it as well Seen in presence of Heather. HPI:    Kathy Dorsey is a 37 y.o. female  Occ RUQ abdominal pain with bloating x intermittently over the last several years but worse over the last 6 weeks.  Intermittent, sharp, occasionally worse after eating, occasional radiation to the back Some abdominal bloating with occasional nausea but no vomiting. No significant diarrhea and constipation Occ dysphagia to breads, no odynophagia. Had cardiac evaluation due to noncardiac chest pains which was unremarkable. She has history of endometriosis and had uterine cysts -has appointment with GYN. No recent weight loss or loss of appetite. No fever or chills    Past Medical History:  Diagnosis Date  . Arthritis   . Cough 01/09/2017  . Dysphagia   . Endometriosis 04/18/2018  . Exposure to the flu 01/09/2017  . Fibroids 04/18/2018  . Jerking movements of extremities 04/15/2015  . Maternal age 47+, multigravida, antepartum 02/18/2016  . Myalgia 04/15/2015  . Myoclonic disorder 04/18/2018  . Numbness and tingling of both legs 04/15/2015  . Palpitations 01/29/2017  . POTS (postural orthostatic tachycardia syndrome)   . Shortness of breath dyspnea   . Situational anxiety 09/28/2016  . SVD (spontaneous vaginal delivery) 03/05/2016    Past Surgical History:  Procedure Laterality Date  . ABDOMINAL EXPLORATION SURGERY  02/2014    Family History  Problem Relation Age of Onset  .  Diabetes Mother   . Breast cancer Mother 63  . Rheum arthritis Unknown   . Diabetes Maternal Grandmother   . Pancreatic cancer Maternal Grandmother   . Diabetes Paternal Grandfather   . Cancer Paternal Grandfather   . Epilepsy Sister   . Heart Problems Sister   . Liver disease Maternal Grandfather     Social History   Tobacco Use  . Smoking status: Never Smoker  . Smokeless tobacco: Never Used  Substance Use Topics  . Alcohol use: No  . Drug use: No    No current outpatient medications on file.   No current facility-administered medications for this visit.     Allergies  Allergen Reactions  . Prednisone Palpitations and Other (See Comments)    Reaction:  Joint pain     Review of Systems:  Constitutional: Denies fever, chills, diaphoresis, appetite change and has fatigue and headaches  HEENT: Denies photophobia, eye pain, redness, hearing loss, ear pain, congestion, sore throat, rhinorrhea, sneezing, mouth sores, neck pain, neck stiffness and tinnitus.   Respiratory: Denies SOB, DOE, cough, chest tightness,  and wheezing.   Cardiovascular: Denies chest pain, palpitations and leg swelling.  Genitourinary: Denies dysuria, urgency, frequency, hematuria, flank pain and difficulty urinating.  Musculoskeletal: Denies myalgias,  joint swelling, arthralgias and gait problem. Has back pain Skin: No rash.  Neurological: Denies dizziness, seizures, syncope, weakness, light-headedness, numbness and has headaches.  Hematological: Denies adenopathy. Easy bruising, personal or family bleeding history  Psychiatric/Behavioral: Has anxiety, no depression     Physical  Exam:    BP 110/72   Pulse 86   Ht 5\' 4"  (1.626 m)   Wt 138 lb 6 oz (62.8 kg)   BMI 23.75 kg/m  Filed Weights   05/28/18 1016  Weight: 138 lb 6 oz (62.8 kg)   Constitutional:  Well-developed, in no acute distress. Psychiatric: Normal mood and affect. Behavior is normal. HEENT: Pupils normal.  Conjunctivae are  normal. No scleral icterus. Neck supple.  Cardiovascular: Normal rate, regular rhythm. No edema Pulmonary/chest: Effort normal and breath sounds normal. No wheezing, rales or rhonchi. Abdominal: Soft, nondistended. Nontender. Bowel sounds active throughout. There are no masses palpable. No hepatomegaly. Rectal:  defered Neurological: Alert and oriented to person place and time. Skin: Skin is warm and dry. No rashes noted.  Data Reviewed: I have personally reviewed following labs and imaging studies  CBC: CBC Latest Ref Rng & Units 04/11/2018 01/01/2017 03/06/2016  WBC 4.0 - 10.5 K/uL 6.4 5.6 12.8(H)  Hemoglobin 12.0 - 15.0 g/dL 12.2 12.3 9.3(L)  Hematocrit 36.0 - 46.0 % 35.2(L) 36.6 27.5(L)  Platelets 150 - 400 K/uL 225 279.0 204    CMP: CMP Latest Ref Rng & Units 04/11/2018 01/01/2017  Glucose 65 - 99 mg/dL 143(H) 87  BUN 6 - 20 mg/dL 14 8  Creatinine 0.44 - 1.00 mg/dL 0.63 0.77  Sodium 135 - 145 mmol/L 137 136  Potassium 3.5 - 5.1 mmol/L 3.3(L) 3.7  Chloride 101 - 111 mmol/L 105 103  CO2 22 - 32 mmol/L 24 26  Calcium 8.9 - 10.3 mg/dL 8.7(L) 8.9  Total Protein 6.0 - 8.3 g/dL - 7.2  Total Bilirubin 0.2 - 1.2 mg/dL - 0.4  Alkaline Phos 39 - 117 U/L - 42  AST 0 - 37 U/L - 13  ALT 0 - 35 U/L - 9    Reviewed CT scan of the abdomen pelvis with the patient and have personally reviewed it as well History and examination in presence of Heather. Reviewed extensive notes.   Carmell Austria, MD 05/28/2018, 10:23 AM  Cc: Darreld Mclean, MD

## 2018-05-31 ENCOUNTER — Ambulatory Visit: Payer: Commercial Managed Care - PPO | Admitting: Cardiology

## 2018-05-31 ENCOUNTER — Encounter: Payer: Self-pay | Admitting: Cardiology

## 2018-05-31 VITALS — BP 110/66 | HR 90 | Ht 64.0 in | Wt 139.4 lb

## 2018-05-31 DIAGNOSIS — R002 Palpitations: Secondary | ICD-10-CM

## 2018-05-31 DIAGNOSIS — R000 Tachycardia, unspecified: Secondary | ICD-10-CM | POA: Diagnosis not present

## 2018-05-31 DIAGNOSIS — I498 Other specified cardiac arrhythmias: Secondary | ICD-10-CM

## 2018-05-31 DIAGNOSIS — I951 Orthostatic hypotension: Secondary | ICD-10-CM

## 2018-05-31 DIAGNOSIS — G90A Postural orthostatic tachycardia syndrome (POTS): Secondary | ICD-10-CM

## 2018-05-31 DIAGNOSIS — R0789 Other chest pain: Secondary | ICD-10-CM | POA: Diagnosis not present

## 2018-05-31 NOTE — Progress Notes (Signed)
Cardiology Office Note:    Date:  05/31/2018   ID:  Kathy Dorsey, DOB 1981-03-30, MRN [id number]  PCP:  Darreld Mclean, MD  Cardiologist:  Jenne Campus, MD    Referring MD: Darreld Mclean, MD   Chief Complaint  Patient presents with  . 1 month follow up  Doing better  History of Present Illness:    Kathy Dorsey is a 37 y.o. female with atypical chest pain.  She had a stress echocardiogram which was negative.  There is also some issue about her heart rate being elevated.  Also she does have POTS.  Stress echocardiogram was normal we talked about healthy lifestyle we talked about reinitiating her exercises.  She described also to have some palpitations that happen about once a week when she feels her heart spitting up for short.  Of this time.  We will ask her to wear long-term Holter monitor for 7 days.  Past Medical History:  Diagnosis Date  . Arthritis   . Cough 01/09/2017  . Dysphagia   . Endometriosis 04/18/2018  . Exposure to the flu 01/09/2017  . Fibroids 04/18/2018  . Jerking movements of extremities 04/15/2015  . Maternal age 46+, multigravida, antepartum 02/18/2016  . Myalgia 04/15/2015  . Myoclonic disorder 04/18/2018  . Numbness and tingling of both legs 04/15/2015  . Palpitations 01/29/2017  . POTS (postural orthostatic tachycardia syndrome)   . Shortness of breath dyspnea   . Situational anxiety 09/28/2016  . SVD (spontaneous vaginal delivery) 03/05/2016    Past Surgical History:  Procedure Laterality Date  . ABDOMINAL EXPLORATION SURGERY  02/2014    Current Medications: No outpatient medications have been marked as taking for the 05/31/18 encounter (Office Visit) with Park Liter, MD.     Allergies:   Prednisone   Social History   Socioeconomic History  . Marital status: Married    Spouse name: Not on file  . Number of children: Not on file  . Years of education: Not on file  . Highest education level: Not on file  Occupational History  . Not on  file  Social Needs  . Financial resource strain: Not on file  . Food insecurity:    Worry: Not on file    Inability: Not on file  . Transportation needs:    Medical: Not on file    Non-medical: Not on file  Tobacco Use  . Smoking status: Never Smoker  . Smokeless tobacco: Never Used  Substance and Sexual Activity  . Alcohol use: No  . Drug use: No  . Sexual activity: Yes    Birth control/protection: Condom  Lifestyle  . Physical activity:    Days per week: Not on file    Minutes per session: Not on file  . Stress: Not on file  Relationships  . Social connections:    Talks on phone: Not on file    Gets together: Not on file    Attends religious service: Not on file    Active member of club or organization: Not on file    Attends meetings of clubs or organizations: Not on file    Relationship status: Not on file  Other Topics Concern  . Not on file  Social History Narrative   Lives with husband and 2 sons.  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     Family History: The patient's family history includes Breast cancer (age of onset: 19) in her mother; Cancer in her paternal grandfather; Diabetes in  her maternal grandmother, mother, and paternal grandfather; Epilepsy in her sister; Heart Problems in her sister; Liver disease in her maternal grandfather; Pancreatic cancer in her maternal grandmother; Rheum arthritis in her unknown relative. ROS:   Please see the history of present illness.    All 14 point review of systems negative except as described per history of present illness  EKGs/Labs/Other Studies Reviewed:      Recent Labs: 04/11/2018: Hemoglobin 12.2; Platelets 225 05/28/2018: ALT 9; BUN 10; Creatinine, Ser 0.84; Potassium 4.4; Sodium 140  Recent Lipid Panel No results found for: CHOL, TRIG, HDL, CHOLHDL, VLDL, LDLCALC, LDLDIRECT  Physical Exam:    VS:  BP 110/66   Pulse 90   Ht 5\' 4"  (1.626 m)   Wt 139 lb 6.4 oz (63.2 kg)   SpO2 98%   BMI 23.93 kg/m      Wt Readings from Last 3 Encounters:  05/31/18 139 lb 6.4 oz (63.2 kg)  05/28/18 138 lb 6 oz (62.8 kg)  04/19/18 137 lb (62.1 kg)     GEN:  Well nourished, well developed in no acute distress HEENT: Normal NECK: No JVD; No carotid bruits LYMPHATICS: No lymphadenopathy CARDIAC: RRR, no murmurs, no rubs, no gallops RESPIRATORY:  Clear to auscultation without rales, wheezing or rhonchi  ABDOMEN: Soft, non-tender, non-distended MUSCULOSKELETAL:  No edema; No deformity  SKIN: Warm and dry LOWER EXTREMITIES: no swelling NEUROLOGIC:  Alert and oriented x 3 PSYCHIATRIC:  Normal affect   ASSESSMENT:    1. Palpitations   2. POTS (postural orthostatic tachycardia syndrome)   3. Atypical chest pain    PLAN:    In order of problems listed above:  1. Mutations: We will put Holter monitor for 7 days 2. Postural orthostatic tachycardia syndrome.  She knows what to do about it and she is coping with this quite well we will continue.  Exercising that she will reinitiate should help a great deal 3. Typical chest pain stress test negative is fine for her to exercise.  3 months sooner she had a problem   Medication Adjustments/Labs and Tests Ordered: Current medicines are reviewed at length with the patient today.  Concerns regarding medicines are outlined above.  No orders of the defined types were placed in this encounter.  Medication changes: No orders of the defined types were placed in this encounter.   Signed, Park Liter, MD, [facility] 05/31/2018 11:09 AM    [place]

## 2018-05-31 NOTE — Patient Instructions (Addendum)
Medication Instructions:  Your physician recommends that you continue on your current medications as directed. Please refer to the Current Medication list given to you today.   Labwork: NONE  Testing/Procedures: Your physician has recommended that you wear a holter monitor. Holter monitors are medical devices that record the heart's electrical activity. Doctors most often use these monitors to diagnose arrhythmias. Arrhythmias are problems with the speed or rhythm of the heartbeat. The monitor is a small, portable device. You can wear one while you do your normal daily activities. This is usually used to diagnose what is causing palpitations/syncope (passing out).  7 DAY     Follow-Up: Your physician wants you to follow-up in: 3 months. You will receive a reminder letter in the mail two months in advance. If you don't receive a letter, please call our office to schedule the follow-up appointment.   Any Other Special Instructions Will Be Listed Below (If Applicable).     If you need a refill on your cardiac medications before your next appointment, please call your pharmacy.

## 2018-06-02 LAB — GLIADIN ANTIBODIES, SERUM
GLIADIN IGA: 4 U
Gliadin IgG: 2 Units

## 2018-06-02 LAB — RETICULIN ANTIBODIES, IGA W TITER: Reticulin IGA Screen: NEGATIVE

## 2018-06-02 LAB — TISSUE TRANSGLUTAMINASE, IGA: (tTG) Ab, IgA: 1 U/mL

## 2018-06-03 DIAGNOSIS — Z6823 Body mass index (BMI) 23.0-23.9, adult: Secondary | ICD-10-CM | POA: Diagnosis not present

## 2018-06-03 DIAGNOSIS — R319 Hematuria, unspecified: Secondary | ICD-10-CM | POA: Diagnosis not present

## 2018-06-03 DIAGNOSIS — Z1389 Encounter for screening for other disorder: Secondary | ICD-10-CM | POA: Diagnosis not present

## 2018-06-03 DIAGNOSIS — Z01419 Encounter for gynecological examination (general) (routine) without abnormal findings: Secondary | ICD-10-CM | POA: Diagnosis not present

## 2018-06-03 DIAGNOSIS — Z130 Encounter for screening for diseases of the blood and blood-forming organs and certain disorders involving the immune mechanism: Secondary | ICD-10-CM | POA: Diagnosis not present

## 2018-06-07 ENCOUNTER — Encounter: Payer: Self-pay | Admitting: Gastroenterology

## 2018-06-19 ENCOUNTER — Encounter: Payer: Commercial Managed Care - PPO | Admitting: Gastroenterology

## 2018-08-23 DIAGNOSIS — Z3202 Encounter for pregnancy test, result negative: Secondary | ICD-10-CM | POA: Diagnosis not present

## 2018-08-23 DIAGNOSIS — N939 Abnormal uterine and vaginal bleeding, unspecified: Secondary | ICD-10-CM | POA: Insufficient documentation

## 2018-09-26 DIAGNOSIS — N926 Irregular menstruation, unspecified: Secondary | ICD-10-CM | POA: Diagnosis not present

## 2018-11-15 DIAGNOSIS — Z3201 Encounter for pregnancy test, result positive: Secondary | ICD-10-CM | POA: Diagnosis not present

## 2018-11-18 DIAGNOSIS — N926 Irregular menstruation, unspecified: Secondary | ICD-10-CM | POA: Diagnosis not present

## 2018-11-22 DIAGNOSIS — Z3A01 Less than 8 weeks gestation of pregnancy: Secondary | ICD-10-CM | POA: Diagnosis not present

## 2018-11-22 DIAGNOSIS — O09511 Supervision of elderly primigravida, first trimester: Secondary | ICD-10-CM | POA: Diagnosis not present

## 2018-11-22 DIAGNOSIS — O3680X0 Pregnancy with inconclusive fetal viability, not applicable or unspecified: Secondary | ICD-10-CM | POA: Diagnosis not present

## 2018-12-11 NOTE — L&D Delivery Note (Signed)
Delivery Note Pt progressed to complete and pushed well.  At 10:24 PM a viable female was delivered via Vaginal, Spontaneous (Presentation: vtx; ROA ).  APGAR: 8, 8; weight pending.   Placenta status: spontaneous, intact.  Cord:  with the following complications: nuchal x 1 reduced.    Anesthesia: Epidural  Episiotomy: None Lacerations:  None Suture Repair: none Est. Blood Loss (mL):  100  Mom to postpartum.  Baby to Couplet care / Skin to Skin.  They desire circumcision, I do not see that they have paid the office.  Kathy Dorsey Aqib Lough 06/15/2019, 10:36 PM

## 2018-12-20 DIAGNOSIS — Z3490 Encounter for supervision of normal pregnancy, unspecified, unspecified trimester: Secondary | ICD-10-CM | POA: Insufficient documentation

## 2018-12-23 DIAGNOSIS — O26891 Other specified pregnancy related conditions, first trimester: Secondary | ICD-10-CM | POA: Diagnosis not present

## 2018-12-23 DIAGNOSIS — Z3A11 11 weeks gestation of pregnancy: Secondary | ICD-10-CM | POA: Diagnosis not present

## 2018-12-23 DIAGNOSIS — O09521 Supervision of elderly multigravida, first trimester: Secondary | ICD-10-CM | POA: Diagnosis not present

## 2018-12-23 DIAGNOSIS — O210 Mild hyperemesis gravidarum: Secondary | ICD-10-CM | POA: Insufficient documentation

## 2018-12-23 DIAGNOSIS — Z3A10 10 weeks gestation of pregnancy: Secondary | ICD-10-CM | POA: Diagnosis not present

## 2018-12-23 LAB — OB RESULTS CONSOLE GC/CHLAMYDIA
Chlamydia: NEGATIVE
Gonorrhea: NEGATIVE

## 2018-12-23 LAB — OB RESULTS CONSOLE HEPATITIS B SURFACE ANTIGEN: Hepatitis B Surface Ag: NEGATIVE

## 2018-12-23 LAB — OB RESULTS CONSOLE HIV ANTIBODY (ROUTINE TESTING): HIV: NONREACTIVE

## 2018-12-23 LAB — OB RESULTS CONSOLE RUBELLA ANTIBODY, IGM: Rubella: IMMUNE

## 2019-01-09 DIAGNOSIS — Z3A13 13 weeks gestation of pregnancy: Secondary | ICD-10-CM | POA: Diagnosis not present

## 2019-02-11 DIAGNOSIS — Z3A18 18 weeks gestation of pregnancy: Secondary | ICD-10-CM | POA: Diagnosis not present

## 2019-02-11 DIAGNOSIS — O09512 Supervision of elderly primigravida, second trimester: Secondary | ICD-10-CM | POA: Diagnosis not present

## 2019-04-22 DIAGNOSIS — O09523 Supervision of elderly multigravida, third trimester: Secondary | ICD-10-CM | POA: Diagnosis not present

## 2019-04-22 DIAGNOSIS — Z3A28 28 weeks gestation of pregnancy: Secondary | ICD-10-CM | POA: Diagnosis not present

## 2019-04-22 DIAGNOSIS — W57XXXA Bitten or stung by nonvenomous insect and other nonvenomous arthropods, initial encounter: Secondary | ICD-10-CM | POA: Diagnosis not present

## 2019-04-29 DIAGNOSIS — O09523 Supervision of elderly multigravida, third trimester: Secondary | ICD-10-CM | POA: Diagnosis not present

## 2019-04-29 DIAGNOSIS — Z23 Encounter for immunization: Secondary | ICD-10-CM | POA: Diagnosis not present

## 2019-04-29 DIAGNOSIS — Z3A29 29 weeks gestation of pregnancy: Secondary | ICD-10-CM | POA: Diagnosis not present

## 2019-06-06 LAB — OB RESULTS CONSOLE GBS: GBS: NEGATIVE

## 2019-06-15 ENCOUNTER — Other Ambulatory Visit: Payer: Self-pay

## 2019-06-15 ENCOUNTER — Encounter (HOSPITAL_COMMUNITY): Payer: Self-pay

## 2019-06-15 ENCOUNTER — Inpatient Hospital Stay (HOSPITAL_COMMUNITY): Payer: Commercial Managed Care - PPO | Admitting: Anesthesiology

## 2019-06-15 ENCOUNTER — Inpatient Hospital Stay (HOSPITAL_COMMUNITY)
Admission: AD | Admit: 2019-06-15 | Discharge: 2019-06-17 | DRG: 805 | Disposition: A | Discharge status: Home / Self Care | Payer: Commercial Managed Care - PPO | Attending: Obstetrics and Gynecology | Admitting: Obstetrics and Gynecology

## 2019-06-15 DIAGNOSIS — Z1159 Encounter for screening for other viral diseases: Secondary | ICD-10-CM

## 2019-06-15 DIAGNOSIS — Z3A36 36 weeks gestation of pregnancy: Secondary | ICD-10-CM

## 2019-06-15 DIAGNOSIS — O26893 Other specified pregnancy related conditions, third trimester: Secondary | ICD-10-CM | POA: Diagnosis present

## 2019-06-15 LAB — CBC
HCT: 33.9 % — ABNORMAL LOW (ref 36.0–46.0)
Hemoglobin: 11 g/dL — ABNORMAL LOW (ref 12.0–15.0)
MCH: 29.3 pg (ref 26.0–34.0)
MCHC: 32.4 g/dL (ref 30.0–36.0)
MCV: 90.4 fL (ref 80.0–100.0)
Platelets: 205 10*3/uL (ref 150–400)
RBC: 3.75 MIL/uL — ABNORMAL LOW (ref 3.87–5.11)
RDW: 16 % — ABNORMAL HIGH (ref 11.5–15.5)
WBC: 8.2 10*3/uL (ref 4.0–10.5)
nRBC: 0 % (ref 0.0–0.2)

## 2019-06-15 LAB — POCT FERN TEST: POCT Fern Test: NEGATIVE

## 2019-06-15 LAB — TYPE AND SCREEN
ABO/RH(D): O POS
Antibody Screen: NEGATIVE

## 2019-06-15 LAB — SARS CORONAVIRUS 2 BY RT PCR (HOSPITAL ORDER, PERFORMED IN ~~LOC~~ HOSPITAL LAB): SARS Coronavirus 2: NEGATIVE

## 2019-06-15 LAB — ABO/RH: ABO/RH(D): O POS

## 2019-06-15 LAB — RPR: RPR Ser Ql: NONREACTIVE

## 2019-06-15 MED ORDER — PHENYLEPHRINE 40 MCG/ML (10ML) SYRINGE FOR IV PUSH (FOR BLOOD PRESSURE SUPPORT)
80.0000 ug | PREFILLED_SYRINGE | INTRAVENOUS | Status: DC | PRN
  Filled 2019-06-15 (×2): qty 10

## 2019-06-15 MED ORDER — PHENYLEPHRINE 40 MCG/ML (10ML) SYRINGE FOR IV PUSH (FOR BLOOD PRESSURE SUPPORT)
80.0000 ug | PREFILLED_SYRINGE | INTRAVENOUS | Status: DC | PRN
  Administered 2019-06-15 (×2): 80 ug via INTRAVENOUS

## 2019-06-15 MED ORDER — LACTATED RINGERS IV SOLN
500.0000 mL | Freq: Once | INTRAVENOUS | Status: AC
  Administered 2019-06-15: 500 mL via INTRAVENOUS

## 2019-06-15 MED ORDER — PENICILLIN G 3 MILLION UNITS IVPB - SIMPLE MED
3.0000 10*6.[IU] | INTRAVENOUS | Status: DC
  Filled 2019-06-15 (×2): qty 100

## 2019-06-15 MED ORDER — OXYTOCIN 40 UNITS IN NORMAL SALINE INFUSION - SIMPLE MED: 2.5000 [IU]/h | INTRAVENOUS | Status: DC

## 2019-06-15 MED ORDER — OXYTOCIN BOLUS FROM INFUSION
500.0000 mL | Freq: Once | INTRAVENOUS | Status: AC
  Administered 2019-06-15: 500 mL via INTRAVENOUS

## 2019-06-15 MED ORDER — OXYCODONE-ACETAMINOPHEN 5-325 MG PO TABS: 1.0000 | ORAL_TABLET | ORAL | Status: DC | PRN

## 2019-06-15 MED ORDER — SODIUM CHLORIDE (PF) 0.9 % IJ SOLN
INTRAMUSCULAR | Status: DC | PRN
  Administered 2019-06-15: 12 mL/h via EPIDURAL

## 2019-06-15 MED ORDER — BETAMETHASONE SOD PHOS & ACET 6 (3-3) MG/ML IJ SUSP
12.0000 mg | INTRAMUSCULAR | Status: DC
  Administered 2019-06-15: 12 mg via INTRAMUSCULAR
  Filled 2019-06-15 (×2): qty 2

## 2019-06-15 MED ORDER — SOD CITRATE-CITRIC ACID 500-334 MG/5ML PO SOLN: 30.0000 mL | ORAL | Status: DC | PRN

## 2019-06-15 MED ORDER — LIDOCAINE HCL (PF) 1 % IJ SOLN: 30.0000 mL | INTRAMUSCULAR | Status: DC | PRN

## 2019-06-15 MED ORDER — DIPHENHYDRAMINE HCL 50 MG/ML IJ SOLN: 12.5000 mg | INTRAMUSCULAR | Status: DC | PRN

## 2019-06-15 MED ORDER — LACTATED RINGERS IV SOLN
500.0000 mL | INTRAVENOUS | Status: DC | PRN
  Administered 2019-06-15: 1000 mL via INTRAVENOUS
  Administered 2019-06-15 (×2): 500 mL via INTRAVENOUS

## 2019-06-15 MED ORDER — ACETAMINOPHEN 325 MG PO TABS
650.0000 mg | ORAL_TABLET | ORAL | Status: DC | PRN
  Administered 2019-06-15 (×2): 650 mg via ORAL
  Filled 2019-06-15 (×2): qty 2

## 2019-06-15 MED ORDER — ONDANSETRON HCL 4 MG/2ML IJ SOLN: 4.0000 mg | Freq: Four times a day (QID) | INTRAMUSCULAR | Status: DC | PRN

## 2019-06-15 MED ORDER — EPHEDRINE 5 MG/ML INJ: 10.0000 mg | INTRAVENOUS | Status: DC | PRN

## 2019-06-15 MED ORDER — FENTANYL-BUPIVACAINE-NACL 0.5-0.125-0.9 MG/250ML-% EP SOLN
12.0000 mL/h | EPIDURAL | Status: DC | PRN
  Filled 2019-06-15: qty 250

## 2019-06-15 MED ORDER — LACTATED RINGERS IV BOLUS
1000.0000 mL | Freq: Once | INTRAVENOUS | Status: AC
  Administered 2019-06-15: 1000 mL via INTRAVENOUS

## 2019-06-15 MED ORDER — TERBUTALINE SULFATE 1 MG/ML IJ SOLN: 0.2500 mg | Freq: Once | INTRAMUSCULAR | Status: DC | PRN

## 2019-06-15 MED ORDER — OXYTOCIN 40 UNITS IN NORMAL SALINE INFUSION - SIMPLE MED
1.0000 m[IU]/min | INTRAVENOUS | Status: DC
  Administered 2019-06-15: 21:00:00 1 m[IU]/min via INTRAVENOUS
  Filled 2019-06-15: qty 1000

## 2019-06-15 MED ORDER — OXYCODONE-ACETAMINOPHEN 5-325 MG PO TABS: 2.0000 | ORAL_TABLET | ORAL | Status: DC | PRN

## 2019-06-15 MED ORDER — SODIUM CHLORIDE 0.9 % IV SOLN
5.0000 10*6.[IU] | Freq: Once | INTRAVENOUS | Status: DC
  Filled 2019-06-15: qty 5

## 2019-06-15 MED ORDER — LIDOCAINE-EPINEPHRINE (PF) 2 %-1:200000 IJ SOLN
INTRAMUSCULAR | Status: DC | PRN
  Administered 2019-06-15 (×2): 3 mL via EPIDURAL

## 2019-06-15 MED ORDER — LACTATED RINGERS IV SOLN
INTRAVENOUS | Status: DC
  Administered 2019-06-15 (×5): via INTRAVENOUS

## 2019-06-15 NOTE — MAU Note (Addendum)
Pt c/o contractions since around 2200. Went to bathroom around 0000 and felt a gush of clear fluid. Denies VB, +FM.  Hx of preterm deliveries.

## 2019-06-15 NOTE — Anesthesia Preprocedure Evaluation (Signed)
Anesthesia Evaluation  Patient identified by MRN, date of birth, ID band Patient awake    Reviewed: Allergy & Precautions, NPO status , Patient's Chart, lab work & pertinent test results  Airway Mallampati: I  TM Distance: >3 FB Neck ROM: Full    Dental no notable dental hx.    Pulmonary neg pulmonary ROS,    Pulmonary exam normal breath sounds clear to auscultation       Cardiovascular negative cardio ROS Normal cardiovascular exam Rhythm:Regular Rate:Normal     Neuro/Psych PSYCHIATRIC DISORDERS Anxiety negative neurological ROS     GI/Hepatic negative GI ROS, Neg liver ROS,   Endo/Other  negative endocrine ROS  Renal/GU negative Renal ROS  negative genitourinary   Musculoskeletal negative musculoskeletal ROS (+)   Abdominal   Peds  Hematology negative hematology ROS (+)   Anesthesia Other Findings   Reproductive/Obstetrics                             Anesthesia Physical Anesthesia Plan  ASA: II  Anesthesia Plan: Epidural   Post-op Pain Management:    Induction:   PONV Risk Score and Plan: Treatment may vary due to age or medical condition  Airway Management Planned: Natural Airway  Additional Equipment:   Intra-op Plan:   Post-operative Plan:   Informed Consent: I have reviewed the patients History and Physical, chart, labs and discussed the procedure including the risks, benefits and alternatives for the proposed anesthesia with the patient or authorized representative who has indicated his/her understanding and acceptance.       Plan Discussed with: Anesthesiologist  Anesthesia Plan Comments: (Patient identified. Risks, benefits, options discussed with patient including but not limited to bleeding, infection, nerve damage, paralysis, failed block, incomplete pain control, headache, blood pressure changes, nausea, vomiting, reactions to medication, itching, and post  partum back pain. Confirmed with bedside nurse the patient's most recent platelet count. Confirmed with the patient that they are not taking any anticoagulation, have any bleeding history or any family history of bleeding disorders. Patient expressed understanding and wishes to proceed. All questions were answered. )        Anesthesia Quick Evaluation

## 2019-06-15 NOTE — Anesthesia Procedure Notes (Signed)
Epidural Patient location during procedure: OB Start time: 06/15/2019 7:00 AM End time: 06/15/2019 7:15 AM  Staffing Anesthesiologist: Freddrick March, MD Performed: anesthesiologist   Preanesthetic Checklist Completed: patient identified, pre-op evaluation, timeout performed, IV checked, risks and benefits discussed and monitors and equipment checked  Epidural Patient position: sitting Prep: site prepped and draped and DuraPrep Patient monitoring: continuous pulse ox, blood pressure, heart rate and cardiac monitor Approach: midline Location: L3-L4 Injection technique: LOR air  Needle:  Needle type: Tuohy  Needle gauge: 17 G Needle length: 9 cm Needle insertion depth: 5 cm Catheter type: closed end flexible Catheter size: 19 Gauge Catheter at skin depth: 10 cm Test dose: negative  Assessment Sensory level: T8 Events: blood not aspirated, injection not painful, no injection resistance, negative IV test and no paresthesia  Additional Notes Patient identified. Risks/Benefits/Options discussed with patient including but not limited to bleeding, infection, nerve damage, paralysis, failed block, incomplete pain control, headache, blood pressure changes, nausea, vomiting, reactions to medication both or allergic, itching and postpartum back pain. Confirmed with bedside nurse the patient's most recent platelet count. Confirmed with patient that they are not currently taking any anticoagulation, have any bleeding history or any family history of bleeding disorders. Patient expressed understanding and wished to proceed. All questions were answered. Sterile technique was used throughout the entire procedure. Please see nursing notes for vital signs. Test dose was given through epidural catheter and negative prior to continuing to dose epidural or start infusion. Warning signs of high block given to the patient including shortness of breath, tingling/numbness in hands, complete motor block, or  any concerning symptoms with instructions to call for help. Patient was given instructions on fall risk and not to get out of bed. All questions and concerns addressed with instructions to call with any issues or inadequate analgesia.  Reason for block:procedure for pain

## 2019-06-15 NOTE — MAU Provider Note (Signed)
First Provider Initiated Contact with Patient 06/15/19 0243       S: Ms. Kathy Dorsey is a 38 y.o. [id number] at [redacted]w[redacted]d  who presents to MAU today for labor evaluation and reports one episode of leaking fluid while on the toilet after urinating at 0000.  She had no leakage afterwards on her underwear or pantyliner.  Contractions started at 2200 last night and have been intermittent, low in her abdomen and low back and getting gradually stronger since onset.  She denies vaginal bleeding.  She reports normal fetal movement.    O: BP 112/75 (BP Location: Right Arm)   Pulse 94   Temp 98.6 F (37 C) (Oral)   Resp 18   Ht 5\' 4"  (1.626 m)   Wt 79 kg   BMI 29.90 kg/m  GENERAL: Well-developed, well-nourished female in no acute distress.  HEAD: Normocephalic, atraumatic.  CHEST: Normal effort of breathing, regular heart rate ABDOMEN: Soft, nontender, gravid PELVIC: Normal external female genitalia. Vagina is pink and rugated. Cervix with normal contour, no lesions. Normal discharge.  Negative pooling.   Cervical exam:  Dilation: 3.5 Effacement (%): 70(70-80) Cervical Position: Middle Station: -2 Presentation: Vertex Exam by:: Gilmer Mor RN   Cervix 4/50/-2 by CNM exam during rule out SROM evaluation  Cervix changed to 4-5 cm/80/-2 by RN exam   Fetal Monitoring: Baseline: 130 Variability: moderate Accelerations: present Decelerations: none. Pt repositioned by RN for FHR drop but did not last 15 seconds so does not meet criteria for deceleration.  FHR pattern Category I with accels. Contractions: 2-4 min, mild to palpation   Ferning negative on slide   MDM No evidence of ROM but pt is contracting and uncomfortable at 36 weeks. IV fluids given, pt given additional hour with plan for RN to recheck for labor evaluation.  Cervix changed t o 4-5 cm from 3.5 cm on arrival in MAU.  Hx of two 37 week deliveries and a 35 week delivery.  Pt becoming significantly more uncomfortable with  contractions in MAU.  Contractions 2-4 minutes apart and moderate to palpation.  Recommend admission for preterm labor.  Pt declines IV pain medication in MAU, desires epidural on admission.   A: SIUP at [redacted]w[redacted]d  Membranes intact  Preterm labor GBS negative per pt  P: Admit to L&D BMZ x 2 injections, 24 hours apart Expectant management Pt may have epidural when desired  Elvera Maria, CNM 06/15/2019 3:12 AM

## 2019-06-15 NOTE — Progress Notes (Signed)
Dr. Willis Modena called about GBS status. GBS negative and can discontinue PCN.

## 2019-06-15 NOTE — Progress Notes (Signed)
Feeling more ctx Afeb, VSS FHT- 120s, Cat I, ctx q 2-5 min VE-5/80/-1, vtx Will augment with pitocin, anticipate SVD

## 2019-06-15 NOTE — H&P (Signed)
Kathy Dorsey is a 38 y.o. female, G6 P2123, EGA [redacted] weeks with EDC 8-2 presenting for ctx and possibly leaking fluid.  On eval in MAU, no evidence ROM, but having fairly reg ctx and VE 4-5 cm.  She was admitted and has received an epidural.  Prenatal care essentially uncomplicated, has h/o myoclonic disorder and POTS, also AMA with low risk Panorama.  OB History    Gravida  6   Para  3   Term  2   Preterm  1   AB  2   Living  3     SAB  2   TAB      Ectopic  0   Multiple  0   Live Births  3          Past Medical History:  Diagnosis Date  . Arthritis   . Cough 01/09/2017  . Dysphagia   . Endometriosis 04/18/2018  . Exposure to the flu 01/09/2017  . Fibroids 04/18/2018  . Jerking movements of extremities 04/15/2015  . Maternal age 5+, multigravida, antepartum 02/18/2016  . Myalgia 04/15/2015  . Myoclonic disorder 04/18/2018  . Numbness and tingling of both legs 04/15/2015  . Palpitations 01/29/2017  . POTS (postural orthostatic tachycardia syndrome)   . Shortness of breath dyspnea   . Situational anxiety 09/28/2016  . SVD (spontaneous vaginal delivery) 03/05/2016   Past Surgical History:  Procedure Laterality Date  . ABDOMINAL EXPLORATION SURGERY  02/2014   Family History: family history includes Breast cancer (age of onset: 29) in her mother; Cancer in her paternal grandfather; Diabetes in her maternal grandmother, mother, and paternal grandfather; Epilepsy in her sister; Heart Problems in her sister; Liver disease in her maternal grandfather; Pancreatic cancer in her maternal grandmother; Rheum arthritis in an other family member. Social History:  reports that she has never smoked. She has never used smokeless tobacco. She reports that she does not drink alcohol or use drugs.     Maternal Diabetes: No Genetic Screening: Normal Maternal Ultrasounds/Referrals: Normal Fetal Ultrasounds or other Referrals:  None Maternal Substance Abuse:  No Significant Maternal Medications:   None Significant Maternal Lab Results:  Group B Strep negative Other Comments:  None  Review of Systems  Respiratory: Negative.   Cardiovascular: Negative.    Maternal Medical History:  Reason for admission: Contractions.   Contractions: Frequency: regular.   Perceived severity is moderate.    Fetal activity: Perceived fetal activity is normal.    Prenatal complications: no prenatal complications Prenatal Complications - Diabetes: none.    Dilation: 4.5 Effacement (%): 60 Station: -2 Exam by:: Glade Nurse, RNC Blood pressure (!) 92/56, pulse 67, temperature 99.1 F (37.3 C), temperature source Oral, resp. rate 18, height 5\' 4"  (1.626 m), weight 79 kg, SpO2 100 %, unknown if currently breastfeeding. Maternal Exam:  Uterine Assessment: Contraction strength is moderate.  Contraction frequency is irregular.   Abdomen: Patient reports no abdominal tenderness. Estimated fetal weight is 6 lbs.   Fetal presentation: vertex  Introitus: Normal vulva. Normal vagina.  Amniotic fluid character: not assessed.  Pelvis: adequate for delivery.   Cervix: Cervix evaluated by digital exam.     Fetal Exam Fetal Monitor Review: Mode: ultrasound.   Baseline rate: 110-120.  Variability: moderate (6-25 bpm).   Pattern: accelerations present and no decelerations.    Fetal State Assessment: Category I - tracings are normal.     Physical Exam  Vitals reviewed. Constitutional: She appears well-developed and well-nourished.  Cardiovascular: Normal rate and  regular rhythm.  Respiratory: Effort normal. No respiratory distress.  GI: Soft.  Genitourinary:    Vulva normal.     Prenatal labs: ABO, Rh: --/--/O POS, O POS Performed at [facility], [place] [address] [place], [place] [address]  [phone]) Antibody: NEG (07/05 0336) Rubella:  immune RPR:   NR HBsAg:   Neg HIV:   NR GBS: Negative (06/26 0000)   Assessment/Plan: IUP at 36 weeks in PTL.  She has received an epidural  and is comfortable, received betamethasone.  Per RN, VE had not changed after she received epidural.  Discussed that since she is preterm I will not be aggressive about augmenting labor unless she continues to progress.  Will monitor for further progress   Clarene Duke 06/15/2019, 9:26 AM

## 2019-06-15 NOTE — Progress Notes (Signed)
Comfortable with epidural Afeb, VSS FHT- 120s, Cat I, irreg ctx VE-4/50/-2, vtx, membranes palpable Since only 36 weeks, will continue to monitor expectantly unless something forces me to augment labor

## 2019-06-15 NOTE — Progress Notes (Signed)
Feeling more pain and pressure Afeb, VSS FHT-110-120, ? One prolonged decel vs maternal HR, otherwise Cat I, ctx q 4-6 min VE-5/50/-1 to -2, AROM clear Discussed cervical change, options of observation or AROM, she will probably end up delivering either way, just quicker with AROM.  Discussed chance baby ends up in NICU.  She chose to proceed with AROM, will monitor progress, anticipate SVD

## 2019-06-16 ENCOUNTER — Encounter (HOSPITAL_COMMUNITY): Payer: Self-pay

## 2019-06-16 MED ORDER — ACETAMINOPHEN 325 MG PO TABS: 650.0000 mg | ORAL_TABLET | ORAL | Status: DC | PRN

## 2019-06-16 MED ORDER — OXYCODONE HCL 5 MG PO TABS: 10.0000 mg | ORAL_TABLET | ORAL | Status: DC | PRN

## 2019-06-16 MED ORDER — OXYCODONE HCL 5 MG PO TABS: 5.0000 mg | ORAL_TABLET | ORAL | Status: DC | PRN

## 2019-06-16 MED ORDER — IBUPROFEN 600 MG PO TABS
600.0000 mg | ORAL_TABLET | Freq: Four times a day (QID) | ORAL | Status: DC
  Administered 2019-06-16 – 2019-06-17 (×6): 600 mg via ORAL
  Filled 2019-06-16 (×6): qty 1

## 2019-06-16 MED ORDER — SIMETHICONE 80 MG PO CHEW: 80.0000 mg | CHEWABLE_TABLET | ORAL | Status: DC | PRN

## 2019-06-16 MED ORDER — ONDANSETRON HCL 4 MG PO TABS: 4.0000 mg | ORAL_TABLET | ORAL | Status: DC | PRN

## 2019-06-16 MED ORDER — ZOLPIDEM TARTRATE 5 MG PO TABS: 5.0000 mg | ORAL_TABLET | Freq: Every evening | ORAL | Status: DC | PRN

## 2019-06-16 MED ORDER — METHYLERGONOVINE MALEATE 0.2 MG PO TABS: 0.2000 mg | ORAL_TABLET | ORAL | Status: DC | PRN

## 2019-06-16 MED ORDER — BENZOCAINE-MENTHOL 20-0.5 % EX AERO
1.0000 "application " | INHALATION_SPRAY | CUTANEOUS | Status: DC | PRN
  Administered 2019-06-16: 1 via TOPICAL
  Filled 2019-06-16: qty 56

## 2019-06-16 MED ORDER — COCONUT OIL OIL: 1.0000 "application " | TOPICAL_OIL | Status: DC | PRN

## 2019-06-16 MED ORDER — MEASLES, MUMPS & RUBELLA VAC IJ SOLR: 0.5000 mL | Freq: Once | INTRAMUSCULAR | Status: DC

## 2019-06-16 MED ORDER — TETANUS-DIPHTH-ACELL PERTUSSIS 5-2.5-18.5 LF-MCG/0.5 IM SUSP: 0.5000 mL | Freq: Once | INTRAMUSCULAR | Status: DC

## 2019-06-16 MED ORDER — DIBUCAINE (PERIANAL) 1 % EX OINT: 1.0000 "application " | TOPICAL_OINTMENT | CUTANEOUS | Status: DC | PRN

## 2019-06-16 MED ORDER — WITCH HAZEL-GLYCERIN EX PADS: 1.0000 "application " | MEDICATED_PAD | CUTANEOUS | Status: DC | PRN

## 2019-06-16 MED ORDER — MAGNESIUM HYDROXIDE 400 MG/5ML PO SUSP: 30.0000 mL | ORAL | Status: DC | PRN

## 2019-06-16 MED ORDER — METHYLERGONOVINE MALEATE 0.2 MG/ML IJ SOLN: 0.2000 mg | INTRAMUSCULAR | Status: DC | PRN

## 2019-06-16 MED ORDER — SENNOSIDES-DOCUSATE SODIUM 8.6-50 MG PO TABS
2.0000 | ORAL_TABLET | ORAL | Status: DC
  Administered 2019-06-17: 2 via ORAL
  Filled 2019-06-16: qty 2

## 2019-06-16 MED ORDER — ONDANSETRON HCL 4 MG/2ML IJ SOLN: 4.0000 mg | INTRAMUSCULAR | Status: DC | PRN

## 2019-06-16 MED ORDER — PRENATAL MULTIVITAMIN CH
1.0000 | ORAL_TABLET | Freq: Every day | ORAL | Status: DC
  Administered 2019-06-17: 1 via ORAL
  Filled 2019-06-16 (×2): qty 1

## 2019-06-16 MED ORDER — DIPHENHYDRAMINE HCL 25 MG PO CAPS: 25.0000 mg | ORAL_CAPSULE | Freq: Four times a day (QID) | ORAL | Status: DC | PRN

## 2019-06-16 NOTE — Progress Notes (Signed)
Patient ID: Kathy Dorsey, female   DOB: 01-27-1981, 38 y.o.   MRN: [id number] Pt doing well. Has cramping as expected with breastfeeding. Lochia moderate. Denies lightheadedness, CP, SOB or HA. Bonding well with baby. Worried about swelling in ankles VSS GEN - NAD ABD - soft ND EXT - +1 edema  A/P: PPD#1 s/p svd - stable         Compression socks ordered         Routine pp care          She still wants circ in hospital-will discuss with office

## 2019-06-16 NOTE — Anesthesia Postprocedure Evaluation (Signed)
Anesthesia Post Note  Patient: Kathy Dorsey  Procedure(s) Performed: AN AD HOC LABOR EPIDURAL     Patient location during evaluation: Mother Baby Anesthesia Type: Epidural Level of consciousness: awake and alert Pain management: pain level controlled Vital Signs Assessment: post-procedure vital signs reviewed and stable Respiratory status: spontaneous breathing, nonlabored ventilation and respiratory function stable Cardiovascular status: stable Postop Assessment: no headache, no backache and epidural receding Anesthetic complications: no    Last Vitals:  Vitals:   06/16/19 0132 06/16/19 0550  BP: 95/60 (!) 89/54  Pulse: 69 90  Resp: 16 17  Temp: 36.6 C 36.7 C  SpO2:      Last Pain:  Vitals:   06/16/19 0550  TempSrc: Oral  PainSc: 0-No pain   Pain Goal:                   [name]

## 2019-06-16 NOTE — Lactation Note (Signed)
This note was copied from a baby's chart. Lactation Consultation Note Baby 5 hrs old. Mom's 4th baby. Mom BF all of her children for 6 months. Mom stated that she has to supplement along w/BF d/t she doesn't make enough milk. Mom states she pumped, power pumped, all that she could. Baby is LPI wt. 6.12 lbs Mom has LPI information sheet. LC reviewed STS, importance of I&O, post pumping, supplementing, breast massage, STS, supply and demand information.  Mom has attempted to formula feed, baby only too a couple of ml's. Baby isn't interested in feeding.  Baby taken to CN placed under warmer.  RN has set up DEBP. Mom knows to pump every 3 hrs and knows how to hand express afterwards. Encouraged mom if she has questions or needs assistance to call. Lactation brochure given.  Patient Name: Kathy Dorsey [id number]'Y Date: 06/16/2019 Reason for consult: Initial assessment;Late-preterm 34-36.6wks   Maternal Data Has patient been taught Hand Expression?: Yes Does the patient have breastfeeding experience prior to this delivery?: Yes  Feeding Feeding Type: Bottle Fed - Formula  LATCH Score Latch: Repeated attempts needed to sustain latch, nipple held in mouth throughout feeding, stimulation needed to elicit sucking reflex.  Audible Swallowing: A few with stimulation  Type of Nipple: Everted at rest and after stimulation  Comfort (Breast/Nipple): Soft / non-tender  Hold (Positioning): Assistance needed to correctly position infant at breast and maintain latch.  LATCH Score: 7  Interventions Interventions: Breast feeding basics reviewed;DEBP;Breast massage;Hand express;Breast compression  Lactation Tools Discussed/Used Tools: Pump Breast pump type: Double-Electric Breast Pump Pump Review: Milk Storage Initiated by:: Wilhemina Cash RN Date initiated:: 06/16/19   Consult Status Consult Status: Follow-up Date: 06/16/19 Follow-up type: In-patient    Jhade Berko, Elta Guadeloupe 06/16/2019,  3:58 AM

## 2019-06-17 ENCOUNTER — Ambulatory Visit: Payer: Self-pay

## 2019-06-17 MED ORDER — IBUPROFEN 600 MG PO TABS: 600.0000 mg | ORAL_TABLET | Freq: Four times a day (QID) | ORAL | 1 refills | Status: DC | PRN

## 2019-06-17 MED ORDER — PRENATAL MULTIVITAMIN CH: 1.0000 | ORAL_TABLET | Freq: Every day | ORAL | 3 refills | Status: DC

## 2019-06-17 NOTE — Progress Notes (Signed)
Post Partum Day 2 Subjective: no complaints, up ad lib, voiding, tolerating PO and nl lochia, pain controlled  Objective: Blood pressure 104/65, pulse 63, temperature 98.1 F (36.7 C), temperature source Oral, resp. rate 18, height 5\' 4"  (1.626 m), weight 79 kg, SpO2 99 %, unknown if currently breastfeeding.  Physical Exam:  General: alert and no distress Lochia: appropriate Uterine Fundus: firm   Recent Labs    06/15/19 0336  HGB 11.0*  HCT 33.9*    Assessment/Plan: Discharge home, Breastfeeding and Lactation consult.  Routine PP care.  F/u 6 weeks.  D/c with Motrin and PNV.     LOS: 2 days   Kathy Dorsey 06/17/2019, 7:51 AM

## 2019-06-17 NOTE — Discharge Summary (Signed)
OB Discharge Summary     Patient Name: Kathy Dorsey DOB: 05/03/1981 MRN: [id number]  Date of admission: 06/15/2019 Delivering MD: Willis Modena, TODD   Date of discharge: 06/17/2019  Admitting diagnosis: 36 WKS, CTX,LEAKING FLUIDS Intrauterine pregnancy: [redacted]w[redacted]d     Secondary diagnosis:  Active Problems:   SVD (spontaneous vaginal delivery)   Preterm labor without delivery, third trimester  Additional problems: N/A     Discharge diagnosis: Preterm Pregnancy Delivered                                                                                                Post partum procedures:N/A  Augmentation: Pitocin  Complications: None  Hospital course:  Onset of Labor With Vaginal Delivery     38 y.o. yo [id number] at [redacted]w[redacted]d was admitted in Latent Labor on 06/15/2019. Patient had an uncomplicated labor course as follows:  Membrane Rupture Time/Date: 6:38 PM ,06/15/2019   Intrapartum Procedures: Episiotomy: None [1]                                         Lacerations:  None [1]  Patient had a delivery of a Viable infant. 06/15/2019  Information for the patient's newborn:  Avionna, Bower [[id number]]  Delivery Method: Vaginal, Spontaneous(Filed from Delivery Summary)     Pateint had an uncomplicated postpartum course.  She is ambulating, tolerating a regular diet, passing flatus, and urinating well. Patient is discharged home in stable condition on 06/17/19.   Physical exam  Vitals:   06/16/19 0940 06/16/19 1333 06/16/19 2219 06/17/19 0552  BP: (!) 88/56 91/61 94/65  104/65  Pulse: 85 76 74 63  Resp: 18 16 18 18   Temp: 98.2 F (36.8 C) 98.6 F (37 C) 98.5 F (36.9 C) 98.1 F (36.7 C)  TempSrc: Oral Oral Oral Oral  SpO2: 99%     Weight:      Height:       General: alert and no distress Lochia: appropriate Uterine Fundus: firm  Labs: Lab Results  Component Value Date   WBC 8.2 06/15/2019   HGB 11.0 (L) 06/15/2019   HCT 33.9 (L) 06/15/2019   MCV 90.4 06/15/2019   PLT 205  06/15/2019   CMP Latest Ref Rng & Units 05/28/2018  Glucose 70 - 99 mg/dL 92  BUN 6 - 23 mg/dL 10  Creatinine 0.40 - 1.20 mg/dL 0.84  Sodium 135 - 145 mEq/L 140  Potassium 3.5 - 5.1 mEq/L 4.4  Chloride 96 - 112 mEq/L 105  CO2 19 - 32 mEq/L 30  Calcium 8.4 - 10.5 mg/dL 9.2  Total Protein 6.0 - 8.3 g/dL 6.8  Total Bilirubin 0.2 - 1.2 mg/dL 0.4  Alkaline Phos 39 - 117 U/L 42  AST 0 - 37 U/L 11  ALT 0 - 35 U/L 9    Discharge instruction: per After Visit Summary and "Baby and Me Booklet".  After visit meds:  Allergies as of 06/17/2019      Reactions   Prednisone Palpitations, Other (See Comments)  Reaction:  Joint pain       Medication List    TAKE these medications   ibuprofen 600 MG tablet Commonly known as: ADVIL Take 1 tablet (600 mg total) by mouth every 6 (six) hours as needed.   prenatal multivitamin Tabs tablet Take 1 tablet by mouth daily at 12 noon.       Diet: routine diet  Activity: Advance as tolerated. Pelvic rest for 6 weeks.   Outpatient follow up:6 weeks Follow up Appt:No future appointments. Follow up Visit:No follow-ups on file.  Postpartum contraception: Undecided  Newborn Data: Live born female  Birth Weight: 6 lb 12.3 oz (3070 g) APGAR: 8, 9  Newborn Delivery   Birth date/time: 06/15/2019 22:24:00 Delivery type: Vaginal, Spontaneous      Baby Feeding: Breast Disposition:home with mother   06/17/2019 Janyth Contes, MD

## 2019-06-17 NOTE — Lactation Note (Signed)
This note was copied from a baby's chart. Lactation Consultation Note  Patient Name: Kathy Dorsey [id number] Date: 06/17/2019   Dr Earle Gell was made aware that parents had a positive consult with SLP & could likely be discharged home.    Matthias Hughs [facility] 06/17/2019, 3:11 PM

## 2019-06-17 NOTE — Lactation Note (Signed)
This note was copied from a baby's chart. Lactation Consultation Note  Patient Name: Kathy Dorsey [id number] Date: 06/17/2019   This is Mom's 4th baby. Her1st two children were born at 60 weeks. Her last infant was born at 94 weeks. Mom says this infant, 67 weeks, is doing better than the previous sibling (who was in hospital for 4-5 days for jaundice, etc).  Mom reported that the Similac yellow bottle nipple seemed too fast. I tried the Enfamil Extra Slow-Flow nipple, which seemed appropriate, initially, but then began to seem too fast. Heart-shaped tongue noted on extension.   I informed Dr. Earle Gell & requested an SLP consult & notified SLP.   Mom has a Spectra pump at home.   Kathy Dorsey [facility] 06/17/2019, 10:42 AM

## 2019-06-19 ENCOUNTER — Inpatient Hospital Stay (HOSPITAL_COMMUNITY)
Admission: AD | Admit: 2019-06-19 | Discharge: 2019-06-19 | Disposition: A | Discharge status: Home / Self Care | Payer: Commercial Managed Care - PPO | Attending: Obstetrics and Gynecology | Admitting: Obstetrics and Gynecology

## 2019-06-19 ENCOUNTER — Other Ambulatory Visit: Payer: Self-pay

## 2019-06-19 DIAGNOSIS — I9789 Other postprocedural complications and disorders of the circulatory system, not elsewhere classified: Secondary | ICD-10-CM | POA: Insufficient documentation

## 2019-06-19 DIAGNOSIS — O9089 Other complications of the puerperium, not elsewhere classified: Secondary | ICD-10-CM | POA: Diagnosis present

## 2019-06-19 DIAGNOSIS — I498 Other specified cardiac arrhythmias: Secondary | ICD-10-CM | POA: Insufficient documentation

## 2019-06-19 NOTE — MAU Note (Signed)
Pt presents to MAU with complaints of feeling a flutter in her heart. Was evaluated in the ED and sent here for further evaluation. Del vaginally July the 5th

## 2019-06-19 NOTE — ED Notes (Signed)
Report given to [facility] @ MAU.

## 2019-06-24 ENCOUNTER — Ambulatory Visit: Payer: Self-pay

## 2019-06-24 NOTE — Lactation Note (Signed)
This note was copied from a baby's chart. Lactation Consultation Note  Patient Name: Kathy Dorsey Today's Date: 06/24/2019     Maternal Data    Feeding    LATCH Score                   Interventions    Lactation Tools Discussed/Used     Consult Status      Donn Pierini 06/24/2019, 8:26 AM

## 2020-02-18 ENCOUNTER — Ambulatory Visit
Admission: EM | Admit: 2020-02-18 | Discharge: 2020-02-18 | Disposition: A | Discharge status: Home / Self Care | Payer: Commercial Managed Care - PPO | Attending: Internal Medicine | Admitting: Internal Medicine

## 2020-02-18 ENCOUNTER — Encounter: Payer: Self-pay | Admitting: Emergency Medicine

## 2020-02-18 ENCOUNTER — Other Ambulatory Visit: Payer: Self-pay

## 2020-02-18 DIAGNOSIS — R0789 Other chest pain: Secondary | ICD-10-CM | POA: Diagnosis not present

## 2020-02-18 DIAGNOSIS — R000 Tachycardia, unspecified: Secondary | ICD-10-CM

## 2020-02-18 NOTE — ED Notes (Signed)
Patient able to ambulate independently  

## 2020-02-18 NOTE — ED Triage Notes (Signed)
Pt presents to [facility] for assessment after doing her recumbent bike workout on Sunday.  States at the peak she noted her HR to be approx 180.  States hx of POTS, also recently started on Vyvanse.  Patient states she was unable to get her HR to come down with reclining, and rest.  Patient states at that time she also had some mid chest pain.  Since Sunday she has had several intermittent episodes of chest pain, and has a follow up appointment with her PCP (took her off the Vyvanse already) and her cardiologist, but was told to come here in the meantime for further evaluation.

## 2020-02-18 NOTE — ED Provider Notes (Addendum)
[facility]    CSN: [id number] Arrival date & time: 02/18/20  1814      History   Chief Complaint Chief Complaint  Patient presents with  . Chest Pain    HPI Kathy Dorsey is a 39 y.o. female history of pots, myoclonic disorder presenting today for evaluation of chest pain and elevated heart rate.  Patient states on Saturday she exercised on the recumbent bike.  While she was cooling down she noticed a sharp central chest pain as well as she noticed her heart rate was not slowing down as quickly as it typically does.  She has a history of pots, but does not typically have a similar chest pain associated with it.  Notes her heart rate was in the 180s.  She attempted to lie flat as this usually helps with her heart rate, but still with slow to go down.  Heart rate was in the 130s approximately 2 hours after exercising, she also had a persistent discomfort in her chest for 1.5 days.  Symptoms have eased off since Sunday.  Reports heart rate has been slightly "chaotic" and has been jumping around.  Denies other heart issues or hypertension.  Denies diabetes.  Denies tobacco use.  Denies prior DVT/PE.  Has noticed a bruise to right calf, but denies any significant leg pain or leg swelling.  Denies recent fluid loss of vomiting, diarrhea or blood in stool.  Eating and drinking normally.  Typically drinks approximately 64 ounces per day.   HPI  Past Medical History:  Diagnosis Date  . Arthritis   . Cough 01/09/2017  . Dysphagia   . Endometriosis 04/18/2018  . Exposure to the flu 01/09/2017  . Fibroids 04/18/2018  . Jerking movements of extremities 04/15/2015  . Maternal age 85+, multigravida, antepartum 02/18/2016  . Myalgia 04/15/2015  . Myoclonic disorder 04/18/2018  . Numbness and tingling of both legs 04/15/2015  . Palpitations 01/29/2017  . POTS (postural orthostatic tachycardia syndrome)   . Shortness of breath dyspnea   . Situational anxiety 09/28/2016  . SVD (spontaneous vaginal  delivery) 03/05/2016    Patient Active Problem List   Diagnosis Date Noted  . Preterm labor without delivery, third trimester 06/15/2019  . Atypical chest pain 04/19/2018  . Endometriosis 04/18/2018  . Fibroids 04/18/2018  . Myoclonic disorder 04/18/2018  . Palpitations 01/29/2017  . Cough 01/09/2017  . Exposure to the flu 01/09/2017  . Situational anxiety 09/28/2016  . SVD (spontaneous vaginal delivery) 03/05/2016  . Maternal age 23+, multigravida, antepartum 02/18/2016  . Jerking movements of extremities 04/15/2015  . Myalgia 04/15/2015  . Numbness and tingling of both legs 04/15/2015  . POTS (postural orthostatic tachycardia syndrome) 02/05/2015    Past Surgical History:  Procedure Laterality Date  . ABDOMINAL EXPLORATION SURGERY  02/2014    OB History    Gravida  6   Para  4   Term  2   Preterm  2   AB  2   Living  4     SAB  2   TAB      Ectopic  0   Multiple  0   Live Births  4            Home Medications    Prior to Admission medications   Medication Sig Start Date End Date Taking? Authorizing Provider  lisdexamfetamine (VYVANSE) 20 MG capsule Take 20 mg by mouth daily.   Yes [provider]  ibuprofen (ADVIL) 600 MG tablet Take  1 tablet (600 mg total) by mouth every 6 (six) hours as needed. 06/17/19   Bovard-Stuckert, Jeral Fruit, MD  Prenatal Vit-Fe Fumarate-FA (PRENATAL MULTIVITAMIN) TABS tablet Take 1 tablet by mouth daily at 12 noon. 06/17/19   Bovard-StuckertJeral Fruit, MD    Family History Family History  Problem Relation Age of Onset  . Diabetes Mother   . Breast cancer Mother 83  . Rheum arthritis Other   . Diabetes Maternal Grandmother   . Pancreatic cancer Maternal Grandmother   . Diabetes Paternal Grandfather   . Cancer Paternal Grandfather   . Epilepsy Sister   . Heart Problems Sister   . Liver disease Maternal Grandfather     Social History Social History   Tobacco Use  . Smoking status: Never Smoker  . Smokeless  tobacco: Never Used  Substance Use Topics  . Alcohol use: No  . Drug use: No     Allergies   Prednisone   Review of Systems Review of Systems  Constitutional: Negative for fatigue and fever.  HENT: Negative for congestion, sinus pressure and sore throat.   Eyes: Negative for photophobia, pain and visual disturbance.  Respiratory: Negative for cough and shortness of breath.   Cardiovascular: Positive for chest pain and palpitations.  Gastrointestinal: Negative for abdominal pain, nausea and vomiting.  Genitourinary: Negative for decreased urine volume and hematuria.  Musculoskeletal: Negative for myalgias, neck pain and neck stiffness.  Neurological: Positive for headaches. Negative for dizziness, syncope, facial asymmetry, speech difficulty, weakness, light-headedness and numbness.     Physical Exam Triage Vital Signs ED Triage Vitals [02/18/20 1829]  Enc Vitals Group     BP 121/84     Pulse Rate 99     Resp 18     Temp (!) 97.4 F (36.3 C)     Temp Source Oral     SpO2 98 %     Weight      Height      Head Circumference      Peak Flow      Pain Score 0     Pain Loc      Pain Edu?      Excl. in [place]?    No data found.  Updated Vital Signs BP 121/84 (BP Location: Left Arm)   Pulse 99   Temp (!) 97.4 F (36.3 C) (Oral)   Resp 18   LMP 01/21/2020   SpO2 98%   Visual Acuity Right Eye Distance:   Left Eye Distance:   Bilateral Distance:    Right Eye Near:   Left Eye Near:    Bilateral Near:     Physical Exam Vitals and nursing note reviewed.  Constitutional:      General: She is not in acute distress.    Appearance: She is well-developed.  HENT:     Head: Normocephalic and atraumatic.  Eyes:     Extraocular Movements: Extraocular movements intact.     Conjunctiva/sclera: Conjunctivae normal.     Pupils: Pupils are equal, round, and reactive to light.  Cardiovascular:     Rate and Rhythm: Normal rate and regular rhythm.     Heart sounds: No  murmur.  Pulmonary:     Effort: Pulmonary effort is normal. No respiratory distress.     Breath sounds: Normal breath sounds.     Comments: Breathing comfortably at rest, CTABL, no wheezing, rales or other adventitious sounds auscultated  Nontender to palpation Abdominal:     Palpations: Abdomen is soft.  Tenderness: There is no abdominal tenderness.  Musculoskeletal:     Cervical back: Neck supple.  Skin:    General: Skin is warm and dry.  Neurological:     Mental Status: She is alert.      UC Treatments / Results  Labs (all labs ordered are listed, but only abnormal results are displayed) Labs Reviewed  BASIC METABOLIC PANEL  CBC  TSH    EKG   Radiology No results found.  Procedures Procedures (including critical care time)  Medications Ordered in UC Medications - No data to display  Initial Impression / Assessment and Plan / UC Course  I have reviewed the triage vital signs and the nursing notes.  Pertinent labs & imaging results that were available during my care of the patient were reviewed by me and considered in my medical decision making (see chart for details).     EKG normal sinus rhythm, no acute signs of ischemia or infarction, very mild nonspecific ST depression noted in aVF, no persistent depression or abnormalities noted in contiguous leads.  No abnormal beats. Currently without chest pain. Checking BMP, CBC as well as TSH for further evaluation of tachycardia.  Unclear cause of chest pain at this time.  Has follow-up for cardiology on 3/16, advised to follow-up in emergency room if chest pain returning.  Discussed strict return precautions. Patient verbalized understanding and is agreeable with plan.  Final Clinical Impressions(s) / UC Diagnoses   Final diagnoses:  Tachycardia  Atypical chest pain     Discharge Instructions     Blood work pending- I will call if abnormal Continue fluids/gatorade for POTS Follow up with cardiology as  planned Follow up in emergency room if pain returning    ED Prescriptions    None     PDMP not reviewed this encounter.   Joneen Caraway, [place] C, PA-C 02/18/20 1958    Janith Lima, PA-C 02/18/20 2006

## 2020-02-18 NOTE — Discharge Instructions (Addendum)
Blood work pending- I will call if abnormal Continue fluids/gatorade for POTS Follow up with cardiology as planned Follow up in emergency room if pain returning

## 2020-02-19 LAB — CBC
Hematocrit: 41.5 % (ref 34.0–46.6)
Hemoglobin: 13.6 g/dL (ref 11.1–15.9)
MCH: 28.8 pg (ref 26.6–33.0)
MCHC: 32.8 g/dL (ref 31.5–35.7)
MCV: 88 fL (ref 79–97)
Platelets: 325 10*3/uL (ref 150–450)
RBC: 4.72 x10E6/uL (ref 3.77–5.28)
RDW: 14.5 % (ref 11.7–15.4)
WBC: 6.9 10*3/uL (ref 3.4–10.8)

## 2020-02-19 LAB — BASIC METABOLIC PANEL
BUN/Creatinine Ratio: 13 (ref 9–23)
BUN: 12 mg/dL (ref 6–20)
CO2: 24 mmol/L (ref 20–29)
Calcium: 9.4 mg/dL (ref 8.7–10.2)
Chloride: 103 mmol/L (ref 96–106)
Creatinine, Ser: 0.89 mg/dL (ref 0.57–1.00)
GFR calc Af Amer: 94 mL/min/{1.73_m2} (ref >59)
GFR calc non Af Amer: 82 mL/min/{1.73_m2} (ref >59)
Glucose: 99 mg/dL (ref 65–99)
Potassium: 3.9 mmol/L (ref 3.5–5.2)
Sodium: 141 mmol/L (ref 134–144)

## 2020-02-19 LAB — TSH: TSH: 4.85 u[IU]/mL — ABNORMAL HIGH (ref 0.450–4.500)

## 2020-02-23 ENCOUNTER — Ambulatory Visit: Payer: Commercial Managed Care - PPO | Attending: Internal Medicine

## 2020-02-23 DIAGNOSIS — Z23 Encounter for immunization: Secondary | ICD-10-CM

## 2020-02-23 NOTE — Progress Notes (Signed)
   Covid-19 Vaccination Clinic  Name:  Kathy Dorsey    MRN: [id number] DOB: February 22, 1981  02/23/2020  [name] was observed post Covid-19 immunization for 30 minutes based on pre-vaccination screening without incident. She was provided with Vaccine Information Sheet and instruction to access the V-Safe system.   [name] was instructed to call 911 with any severe reactions post vaccine: Marland Kitchen Difficulty breathing  . Swelling of face and throat  . A fast heartbeat  . A bad rash all over body  . Dizziness and weakness   Immunizations Administered    Name Date Dose VIS Date Route   Pfizer COVID-19 Vaccine 02/23/2020  5:02 PM 0.3 mL 11/21/2019 Intramuscular   Manufacturer: [place]   Lot: [id number]   [place]: [id number]

## 2020-03-17 ENCOUNTER — Ambulatory Visit: Payer: Commercial Managed Care - PPO | Attending: Internal Medicine

## 2020-03-17 DIAGNOSIS — Z23 Encounter for immunization: Secondary | ICD-10-CM

## 2020-03-17 NOTE — Progress Notes (Signed)
   Covid-19 Vaccination Clinic  Name:  Srinidhi Setser    MRN: [id number] DOB: 18-Feb-1981  03/17/2020  Ms. Humes was observed post Covid-19 immunization for 30 minutes based on pre-vaccination screening without incident. She was provided with Vaccine Information Sheet and instruction to access the V-Safe system.   Ms. Mccalpin was instructed to call 911 with any severe reactions post vaccine: Marland Kitchen Difficulty breathing  . Swelling of face and throat  . A fast heartbeat  . A bad rash all over body  . Dizziness and weakness   Immunizations Administered    Name Date Dose VIS Date Route   Pfizer COVID-19 Vaccine 03/17/2020  4:48 PM 0.3 mL 11/21/2019 Intramuscular   Manufacturer: [place]   Lot: [id number]   [place]: [id number]

## 2020-04-20 IMAGING — CT CT ABD-PELV W/ CM
2 of 5 series · 16 of 46 positions shown, 18 images · IV contrast (APPLIED)
Comparison: None.

CLINICAL DATA: Generalized abdominal pain and tenderness for 2
weeks.

EXAM:
CT ABDOMEN AND PELVIS WITH CONTRAST
TECHNIQUE: Multidetector CT imaging of the abdomen and pelvis was performed
using the standard protocol following bolus administration of
intravenous contrast.
CONTRAST:  100mL DPDDT8-OXX IOPAMIDOL (DPDDT8-OXX) INJECTION 61%

[Series 2: axial st · axial · 0.71mm/px · z∈[-677,-262]mm · 13 of 91 slices shown, 15 images]
[im 4/91  soft-tissue]
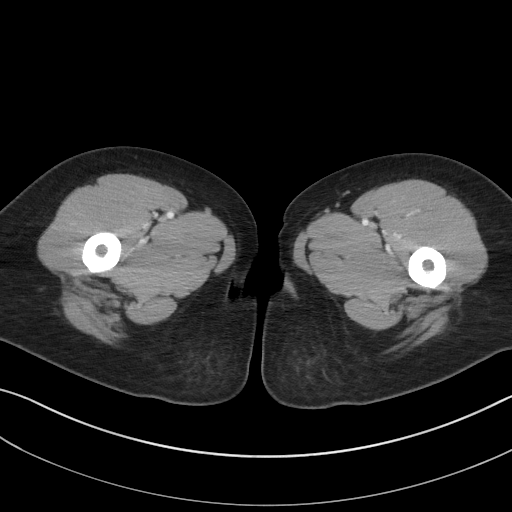
[im 4/91  bone]
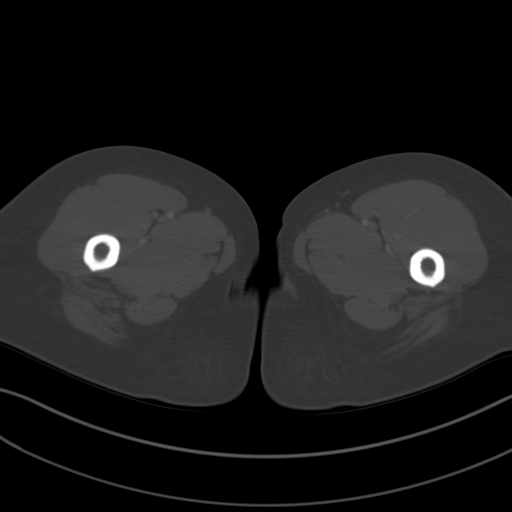
[im 12/91  soft-tissue]
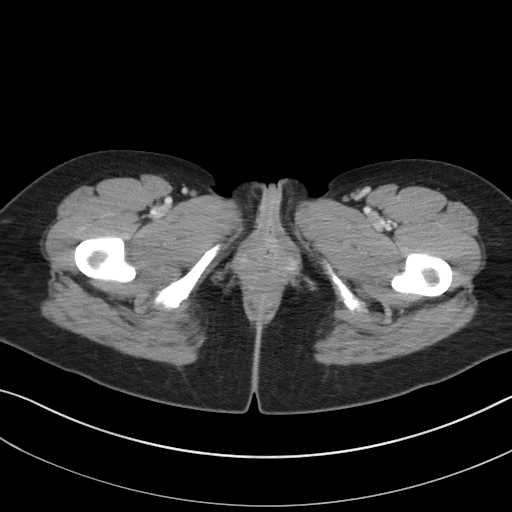
[im 19/91  soft-tissue]
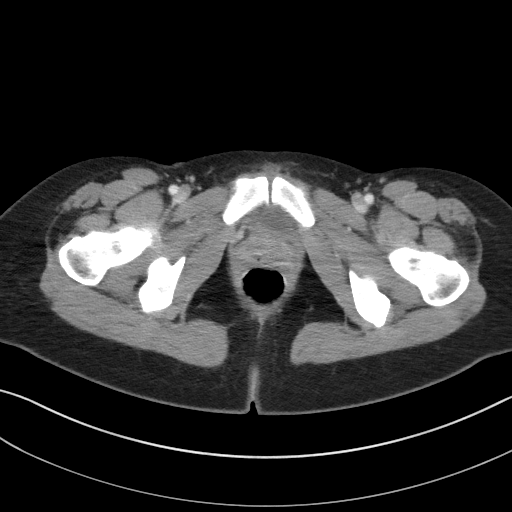
[im 27/91  soft-tissue]
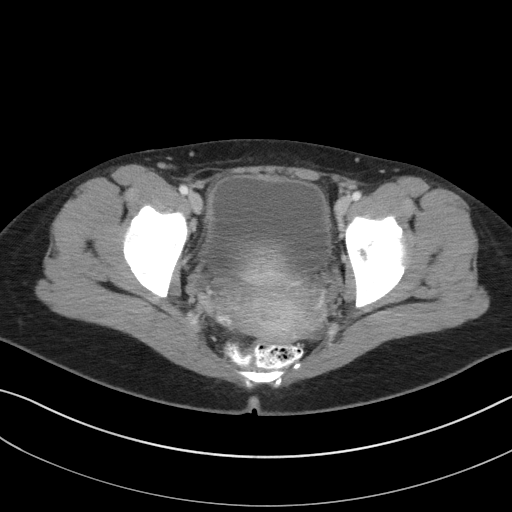
[im 31/91  soft-tissue]
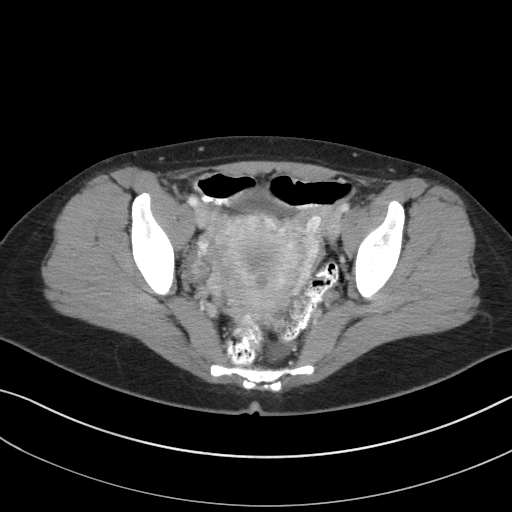
[im 38/91  soft-tissue]
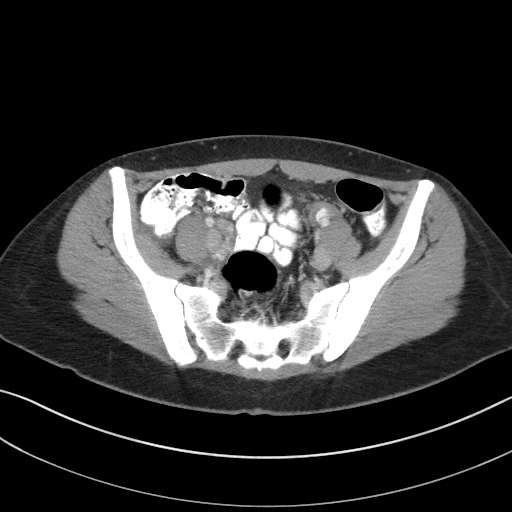
[im 46/91  soft-tissue]
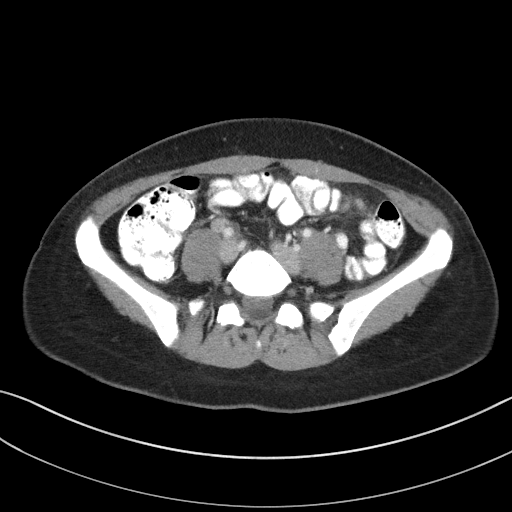
[im 53/91  soft-tissue]
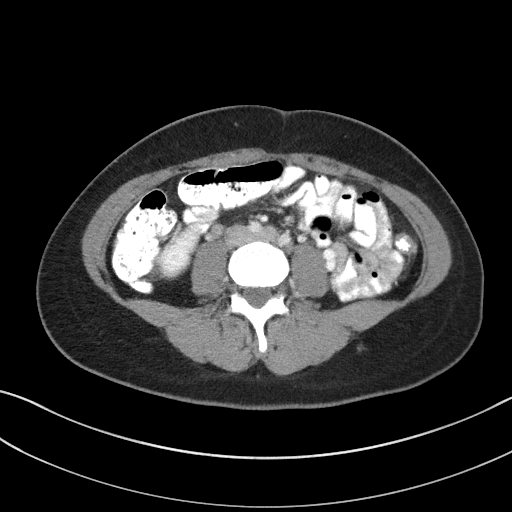
[im 61/91  soft-tissue]
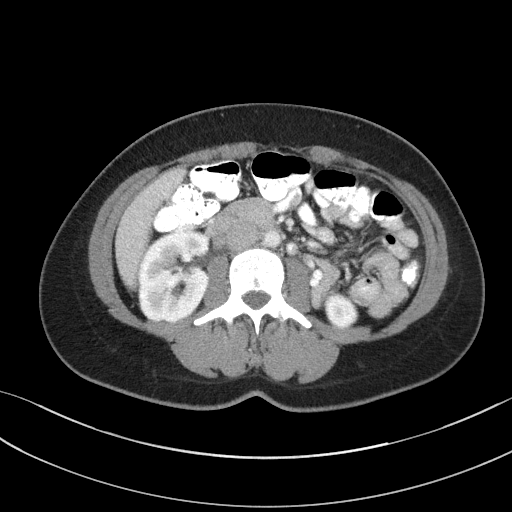
[im 61/91  bone]
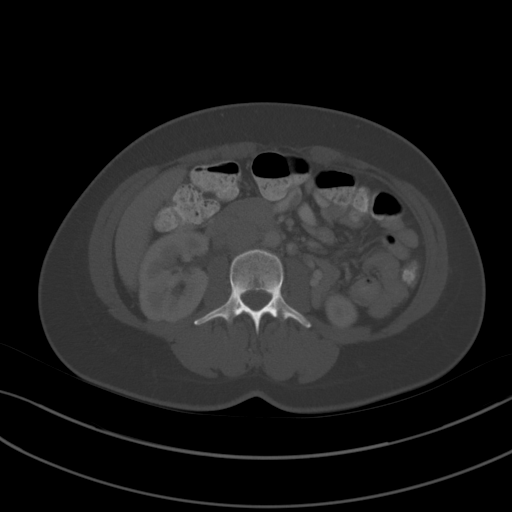
[im 64/91  soft-tissue]
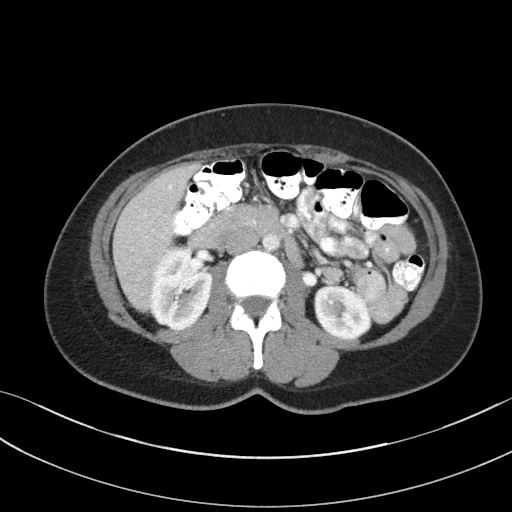
[im 72/91  soft-tissue]
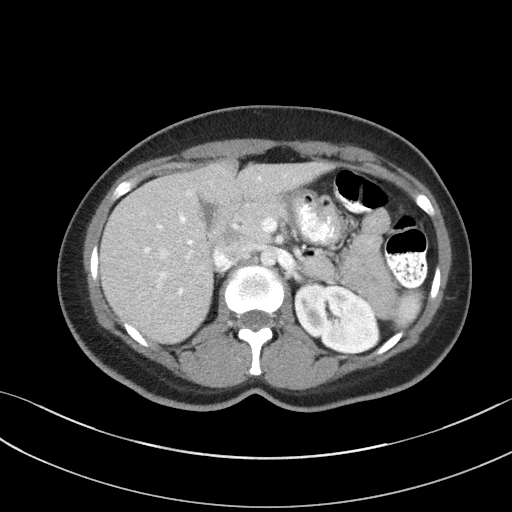
[im 79/91  soft-tissue]
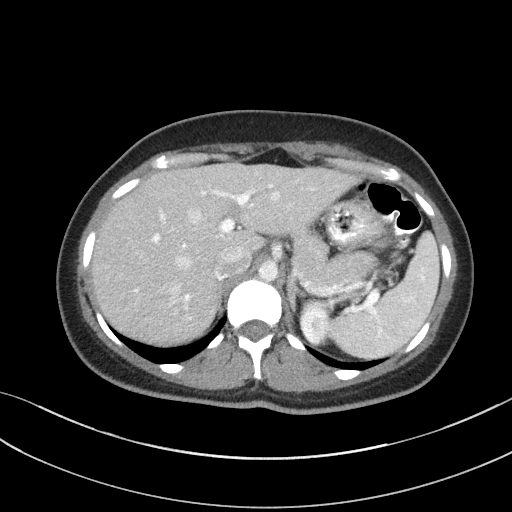
[im 87/91  soft-tissue]
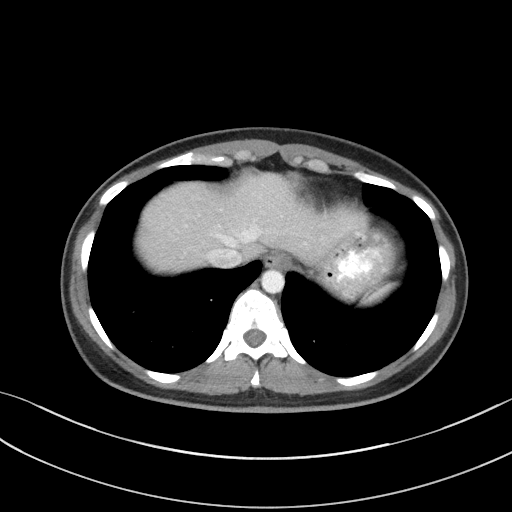

[Series 3: coronal st · coronal · 0.45mm/px · 3 of 101 slices shown]
[im 21/101  soft-tissue]
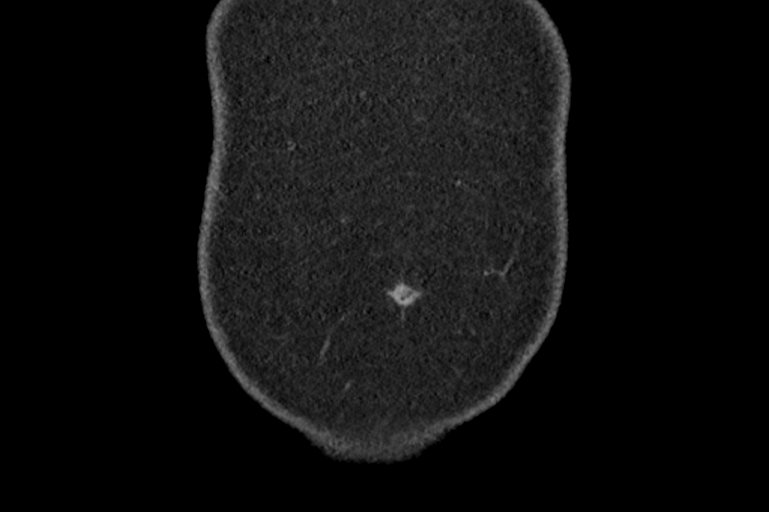
[im 41/101  soft-tissue]
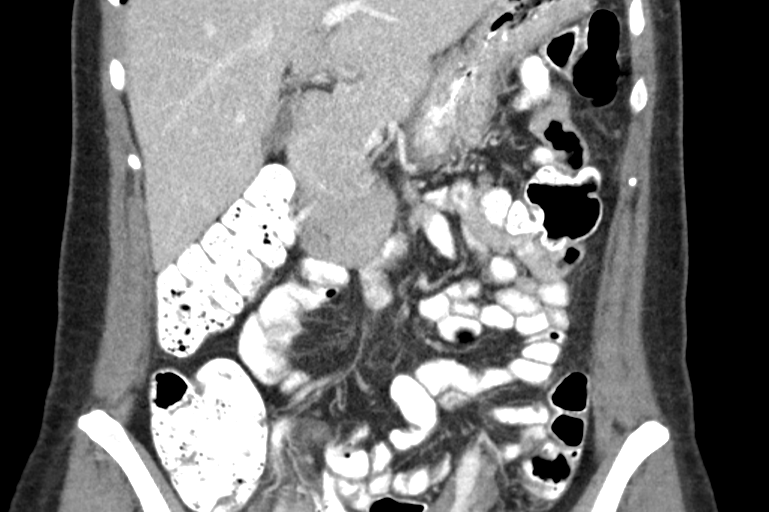
[im 61/101  soft-tissue]
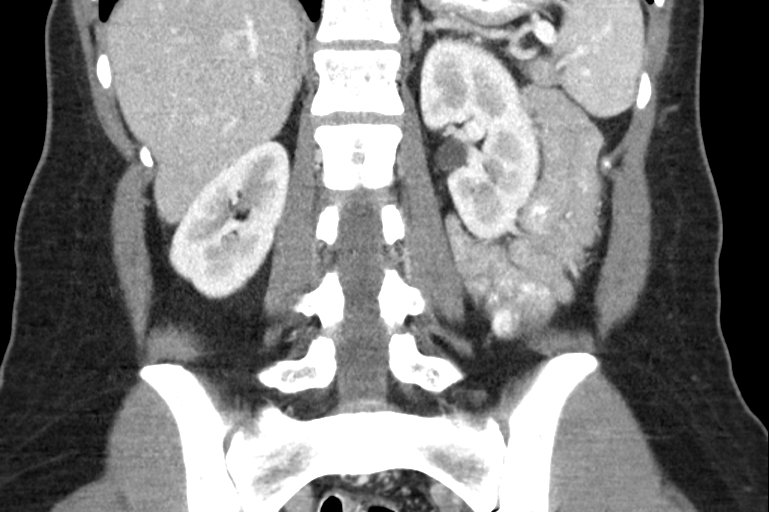

[16 of 46 positions shown; findings below may reference images not displayed]

FINDINGS: Lower chest:  Negative

Hepatobiliary: Tiny low-density in the right liver, likely a cyst.No
evidence of biliary obstruction or stone.

Pancreas: Unremarkable.

Spleen: Unremarkable.

Adrenals/Urinary Tract: Negative adrenals. No hydronephrosis or
stone. Unremarkable bladder.

Stomach/Bowel: No obstruction or inflammatory changes. No
appendicitis.

Vascular/Lymphatic: No acute vascular abnormality. No mass or
adenopathy.

Reproductive:Corpus luteum on the right. Sub endometrial cystic
change at the lower uterine segment, question adenomyosis.

Other: No ascites or pneumoperitoneum.

Musculoskeletal: No acute abnormalities.
IMPRESSION: 1. No specific explanation for abdominal pain.
2. Sub endometrial cysts at the lower uterine segment, are other
symptoms of adenomyosis?

## 2020-11-02 ENCOUNTER — Other Ambulatory Visit: Payer: Self-pay

## 2020-11-02 ENCOUNTER — Ambulatory Visit (INDEPENDENT_AMBULATORY_CARE_PROVIDER_SITE_OTHER): Payer: Commercial Managed Care - PPO

## 2020-11-02 ENCOUNTER — Ambulatory Visit
Admission: EM | Admit: 2020-11-02 | Discharge: 2020-11-02 | Disposition: A | Discharge status: Home / Self Care | Payer: Commercial Managed Care - PPO | Attending: Internal Medicine | Admitting: Internal Medicine

## 2020-11-02 DIAGNOSIS — W19XXXA Unspecified fall, initial encounter: Secondary | ICD-10-CM

## 2020-11-02 DIAGNOSIS — M25571 Pain in right ankle and joints of right foot: Secondary | ICD-10-CM

## 2020-11-02 MED ORDER — CRUTCHES-ALUMINUM MISC: 1.0000 | Freq: Every day | 0 refills | Status: DC

## 2020-11-02 MED ORDER — MELOXICAM 7.5 MG PO TBDP: 15.0000 mg | ORAL_TABLET | Freq: Every day | ORAL | 0 refills | Status: DC

## 2020-11-02 NOTE — ED Triage Notes (Signed)
Pt states she fell going down steps and is unable to bear weight and has limited ROM in her right ankle. Pt is aox4 and unable to ambulate due to pain.

## 2022-01-30 ENCOUNTER — Other Ambulatory Visit: Payer: Self-pay

## 2022-01-30 ENCOUNTER — Ambulatory Visit
Admission: EM | Admit: 2022-01-30 | Discharge: 2022-01-30 | Disposition: A | Discharge status: Home / Self Care | Payer: BC Managed Care – PPO | Attending: Internal Medicine | Admitting: Internal Medicine

## 2022-01-30 ENCOUNTER — Encounter: Payer: Self-pay | Admitting: Emergency Medicine

## 2022-01-30 DIAGNOSIS — J069 Acute upper respiratory infection, unspecified: Secondary | ICD-10-CM | POA: Diagnosis not present

## 2022-01-30 DIAGNOSIS — R051 Acute cough: Secondary | ICD-10-CM | POA: Diagnosis not present

## 2022-01-30 MED ORDER — PROMETHAZINE-DM 6.25-15 MG/5ML PO SYRP: 5.0000 mL | ORAL_SOLUTION | Freq: Four times a day (QID) | ORAL | 0 refills | Status: DC | PRN

## 2022-01-30 MED ORDER — AZITHROMYCIN 500 MG PO TABS: ORAL_TABLET | ORAL | 0 refills | Status: AC

## 2022-01-30 NOTE — ED Triage Notes (Signed)
Patient c/o productive cough and congestion x 9 days.  Patient states that her chest is achy from all of the coughing.  Patient has taken Nyquil and Dayuil, Mucinex w/o relief.  At home COVID test was negative.

## 2022-01-30 NOTE — Discharge Instructions (Signed)
You have been prescribed an antibiotic and a cough medication.  Please be advised that cough medication can cause drowsiness.  If it does cause you to be drowsy, please take at night before you go to bed.

## 2022-01-30 NOTE — ED Provider Notes (Signed)
[facility]    CSN: [id number] Arrival date & time: 01/30/22  1730      History   Chief Complaint Chief Complaint  Patient presents with   Cough    HPI Kathy Dorsey is a 41 y.o. female.   Patient presents with a 9-day history of productive cough and nasal congestion.  Denies any known fevers or sick contacts.  She denies chest pain, shortness of breath, sore throat, ear pain, nausea, vomiting, diarrhea, abdominal pain.  Patient has taken NyQuil, DayQuil, Mucinex with no improvement in symptoms.  She took an at home COVID test that was negative.  Denies history of asthma and patient is not a smoker.   Cough  Past Medical History:  Diagnosis Date   Arthritis    Cough 01/09/2017   Dysphagia    Endometriosis 04/18/2018   Exposure to the flu 01/09/2017   Fibroids 04/18/2018   Jerking movements of extremities 04/15/2015   Maternal age 70+, multigravida, antepartum 02/18/2016   Myalgia 04/15/2015   Myoclonic disorder 04/18/2018   Numbness and tingling of both legs 04/15/2015   Palpitations 01/29/2017   POTS (postural orthostatic tachycardia syndrome)    Shortness of breath dyspnea    Situational anxiety 09/28/2016   SVD (spontaneous vaginal delivery) 03/05/2016    Patient Active Problem List   Diagnosis Date Noted   Preterm labor without delivery, third trimester 06/15/2019   Atypical chest pain 04/19/2018   Endometriosis 04/18/2018   Fibroids 04/18/2018   Myoclonic disorder 04/18/2018   Palpitations 01/29/2017   Cough 01/09/2017   Exposure to the flu 01/09/2017   Situational anxiety 09/28/2016   SVD (spontaneous vaginal delivery) 03/05/2016   Maternal age 31+, multigravida, antepartum 02/18/2016   Jerking movements of extremities 04/15/2015   Myalgia 04/15/2015   Numbness and tingling of both legs 04/15/2015   POTS (postural orthostatic tachycardia syndrome) 02/05/2015    Past Surgical History:  Procedure Laterality Date   ABDOMINAL EXPLORATION SURGERY  02/2014     OB History     Gravida  6   Para  4   Term  2   Preterm  2   AB  2   Living  4      SAB  2   IAB      Ectopic  0   Multiple  0   Live Births  4            Home Medications    Prior to Admission medications   Medication Sig Start Date End Date Taking? Authorizing Provider  azithromycin (ZITHROMAX) 500 MG tablet Take 1 tablet (500 mg total) by mouth daily for 1 day, THEN 0.5 tablets (250 mg total) daily for 4 days. 01/30/22 02/04/22 Yes Arayla Kruschke, Michele Rockers, FNP  ibuprofen (ADVIL) 600 MG tablet Take 1 tablet (600 mg total) by mouth every 6 (six) hours as needed. 06/17/19  Yes Bovard-Stuckert, Jody, MD  promethazine-dextromethorphan (PROMETHAZINE-DM) 6.25-15 MG/5ML syrup Take 5 mLs by mouth 4 (four) times daily as needed for cough. 01/30/22  Yes Zoey Gilkeson, Hildred Alamin E, FNP  lisdexamfetamine (VYVANSE) 20 MG capsule Take 20 mg by mouth daily.    [provider]  Meloxicam 7.5 MG TBDP Take 15 mg by mouth daily. 11/02/20   Lannie Fields, PA-C  Misc. Devices (CRUTCHES-ALUMINUM) MISC 1 Device by Does not apply route daily. 11/02/20   Lannie Fields, PA-C  Prenatal Vit-Fe Fumarate-FA (PRENATAL MULTIVITAMIN) TABS tablet Take 1 tablet by mouth daily at 12 noon. 06/17/19  Bovard-Stuckert, Jeral Fruit, MD    Family History Family History  Problem Relation Age of Onset   Diabetes Mother    Breast cancer Mother 52   Rheum arthritis Other    Diabetes Maternal Grandmother    Pancreatic cancer Maternal Grandmother    Diabetes Paternal Grandfather    Cancer Paternal Grandfather    Epilepsy Sister    Heart Problems Sister    Liver disease Maternal Grandfather     Social History Social History   Tobacco Use   Smoking status: Never   Smokeless tobacco: Never  Vaping Use   Vaping Use: Never used  Substance Use Topics   Alcohol use: No   Drug use: No     Allergies   Prednisone   Review of Systems Review of Systems Per HPI  Physical Exam Triage Vital Signs ED Triage  Vitals [01/30/22 1816]  Enc Vitals Group     BP 120/78     Pulse Rate 74     Resp 18     Temp 98.5 F (36.9 C)     Temp Source Oral     SpO2 98 %     Weight 150 lb (68 kg)     Height 5\' 4"  (1.626 m)     Head Circumference      Peak Flow      Pain Score 3     Pain Loc      Pain Edu?      Excl. in [place]?    No data found.  Updated Vital Signs BP 120/78 (BP Location: Left Arm)    Pulse 74    Temp 98.5 F (36.9 C) (Oral)    Resp 18    Ht 5\' 4"  (1.626 m)    Wt 150 lb (68 kg)    LMP 01/27/2022    SpO2 98%    Breastfeeding No    BMI 25.75 kg/m   Visual Acuity Right Eye Distance:   Left Eye Distance:   Bilateral Distance:    Right Eye Near:   Left Eye Near:    Bilateral Near:     Physical Exam Constitutional:      General: She is not in acute distress.    Appearance: Normal appearance. She is not toxic-appearing or diaphoretic.  HENT:     Head: Normocephalic and atraumatic.     Right Ear: Tympanic membrane and ear canal normal.     Left Ear: Tympanic membrane and ear canal normal.     Nose: Congestion present.     Mouth/Throat:     Mouth: Mucous membranes are moist.     Pharynx: No posterior oropharyngeal erythema.  Eyes:     Extraocular Movements: Extraocular movements intact.     Conjunctiva/sclera: Conjunctivae normal.     Pupils: Pupils are equal, round, and reactive to light.  Cardiovascular:     Rate and Rhythm: Normal rate and regular rhythm.     Pulses: Normal pulses.     Heart sounds: Normal heart sounds.  Pulmonary:     Effort: Pulmonary effort is normal. No respiratory distress.     Breath sounds: Normal breath sounds. No stridor. No wheezing, rhonchi or rales.  Abdominal:     General: Abdomen is flat. Bowel sounds are normal.     Palpations: Abdomen is soft.  Musculoskeletal:        General: Normal range of motion.     Cervical back: Normal range of motion.  Skin:    General: Skin is  warm and dry.  Neurological:     General: No focal deficit present.      Mental Status: She is alert and oriented to person, place, and time. Mental status is at baseline.  Psychiatric:        Mood and Affect: Mood normal.        Behavior: Behavior normal.     UC Treatments / Results  Labs (all labs ordered are listed, but only abnormal results are displayed) Labs Reviewed - No data to display  EKG   Radiology No results found.  Procedures Procedures (including critical care time)  Medications Ordered in UC Medications - No data to display  Initial Impression / Assessment and Plan / UC Course  I have reviewed the triage vital signs and the nursing notes.  Pertinent labs & imaging results that were available during my care of the patient were reviewed by me and considered in my medical decision making (see chart for details).     Will treat upper respiratory infection with cough with azithromycin to cover for atypicals as well as Promethazine DM to take as needed for cough.  Patient was advised that cough medication can cause drowsiness.  Do not think that viral testing is necessary given duration of symptoms.  Do not think that chest imaging is necessary given no adventitious lung sounds on exam and no signs of respiratory compromise.  Discussed return precautions.  Patient verbalized understanding and was agreeable with plan. Final Clinical Impressions(s) / UC Diagnoses   Final diagnoses:  Acute upper respiratory infection  Acute cough     Discharge Instructions      You have been prescribed an antibiotic and a cough medication.  Please be advised that cough medication can cause drowsiness.  If it does cause you to be drowsy, please take at night before you go to bed.    ED Prescriptions     Medication Sig Dispense Auth. Provider   azithromycin (ZITHROMAX) 500 MG tablet Take 1 tablet (500 mg total) by mouth daily for 1 day, THEN 0.5 tablets (250 mg total) daily for 4 days. 3 tablet [place], [place] E, [place]    promethazine-dextromethorphan (PROMETHAZINE-DM) 6.25-15 MG/5ML syrup Take 5 mLs by mouth 4 (four) times daily as needed for cough. 118 mL Teodora Medici, [place]      PDMP not reviewed this encounter.   Teodora Medici, [place] 01/30/22 [phone]

## 2022-09-03 ENCOUNTER — Ambulatory Visit
Admission: RE | Admit: 2022-09-03 | Discharge: 2022-09-03 | Disposition: A | Discharge status: Home / Self Care | Payer: Medicaid Other | Source: Ambulatory Visit | Attending: Emergency Medicine | Admitting: Emergency Medicine

## 2022-09-03 VITALS — BP 104/72 | HR 94 | Temp 98.6°F | Resp 16

## 2022-09-03 DIAGNOSIS — J310 Chronic rhinitis: Secondary | ICD-10-CM

## 2022-09-03 DIAGNOSIS — R058 Other specified cough: Secondary | ICD-10-CM

## 2022-09-03 MED ORDER — IBUPROFEN 400 MG PO TABS: 400.0000 mg | ORAL_TABLET | Freq: Three times a day (TID) | ORAL | 0 refills | Status: DC | PRN

## 2022-09-03 MED ORDER — FLUTICASONE PROPIONATE 50 MCG/ACT NA SUSP: 1.0000 | Freq: Every day | NASAL | 2 refills | Status: DC

## 2022-09-03 MED ORDER — CETIRIZINE HCL 10 MG PO TABS: 10.0000 mg | ORAL_TABLET | Freq: Every day | ORAL | 1 refills | Status: DC

## 2022-09-03 NOTE — ED Triage Notes (Signed)
The patient states she tested positive for Covid 2 weeks ago and states she is still symptomatic (productive cough, congestion, runny nose and sore throat).  Home interventions: motrin

## 2022-09-03 NOTE — ED Provider Notes (Signed)
[facility]    CSN: [id number] Arrival date & time: 09/03/22  2440    HISTORY  No chief complaint on file.  HPI Kathy Dorsey is a pleasant, 41 y.o. female who presents to urgent care today. The patient states she tested positive for Covid 2 weeks ago and states she is still symptomatic (none productive cough, nasal congestion, clear runny nose and scratchy, sore throat). Home interventions: motrin.  Patient states everyone in her home tested positive for COVID and is feeling better however she is still lingering.  Patient denies history of allergies.  Patient denies headache, body ache, fever, chills, nausea, vomiting, diarrhea.  The history is provided by the patient.   Past Medical History:  Diagnosis Date   Arthritis    Cough 01/09/2017   Dysphagia    Endometriosis 04/18/2018   Exposure to the flu 01/09/2017   Fibroids 04/18/2018   Jerking movements of extremities 04/15/2015   Maternal age 30+, multigravida, antepartum 02/18/2016   Myalgia 04/15/2015   Myoclonic disorder 04/18/2018   Numbness and tingling of both legs 04/15/2015   Palpitations 01/29/2017   POTS (postural orthostatic tachycardia syndrome)    Shortness of breath dyspnea    Situational anxiety 09/28/2016   SVD (spontaneous vaginal delivery) 03/05/2016   Patient Active Problem List   Diagnosis Date Noted   Preterm labor without delivery, third trimester 06/15/2019   Atypical chest pain 04/19/2018   Endometriosis 04/18/2018   Fibroids 04/18/2018   Myoclonic disorder 04/18/2018   Palpitations 01/29/2017   Cough 01/09/2017   Exposure to the flu 01/09/2017   Situational anxiety 09/28/2016   SVD (spontaneous vaginal delivery) 03/05/2016   Maternal age 28+, multigravida, antepartum 02/18/2016   Jerking movements of extremities 04/15/2015   Myalgia 04/15/2015   Numbness and tingling of both legs 04/15/2015   POTS (postural orthostatic tachycardia syndrome) 02/05/2015   Past Surgical History:  Procedure  Laterality Date   ABDOMINAL EXPLORATION SURGERY  02/2014   OB History     Gravida  6   Para  4   Term  2   Preterm  2   AB  2   Living  4      SAB  2   IAB      Ectopic  0   Multiple  0   Live Births  4          Home Medications    Prior to Admission medications   Medication Sig Start Date End Date Taking? Authorizing Provider  cetirizine (ZYRTEC ALLERGY) 10 MG tablet Take 1 tablet (10 mg total) by mouth at bedtime. 09/03/22 03/02/23 Yes Lynden Oxford Scales, PA-C  fluticasone (FLONASE) 50 MCG/ACT nasal spray Place 1 spray into both nostrils daily. Begin by using 2 sprays in each nare daily for 3 to 5 days, then decrease to 1 spray in each nare daily. 09/03/22  Yes Lynden Oxford Scales, PA-C  ibuprofen (ADVIL) 400 MG tablet Take 1 tablet (400 mg total) by mouth every 8 (eight) hours as needed for up to 30 doses. 09/03/22  Yes Lynden Oxford Scales, PA-C    Family History Family History  Problem Relation Age of Onset   Diabetes Mother    Breast cancer Mother 37   Rheum arthritis Other    Diabetes Maternal Grandmother    Pancreatic cancer Maternal Grandmother    Diabetes Paternal Grandfather    Cancer Paternal Grandfather    Epilepsy Sister    Heart Problems Sister    Liver  disease Maternal Grandfather    Social History Social History   Tobacco Use   Smoking status: Never   Smokeless tobacco: Never  Vaping Use   Vaping Use: Never used  Substance Use Topics   Alcohol use: No   Drug use: No   Allergies   Lisdexamfetamine and Prednisone  Review of Systems Review of Systems Pertinent findings revealed after performing a 14 point review of systems has been noted in the history of present illness.  Physical Exam Triage Vital Signs ED Triage Vitals  Enc Vitals Group     BP 10/07/21 0827 (!) 147/82     Pulse Rate 10/07/21 0827 72     Resp 10/07/21 0827 18     Temp 10/07/21 0827 98.3 F (36.8 C)     Temp Source 10/07/21 0827 Oral     SpO2  10/07/21 0827 98 %     Weight --      Height --      Head Circumference --      Peak Flow --      Pain Score 10/07/21 0826 5     Pain Loc --      Pain Edu? --      Excl. in [place]? --   No data found.  Updated Vital Signs BP 104/72 (BP Location: Right Arm)   Pulse 94   Temp 98.6 F (37 C) (Oral)   Resp 16   LMP 08/22/2022   SpO2 98%   Physical Exam Vitals and nursing note reviewed.  Constitutional:      General: She is not in acute distress.    Appearance: Normal appearance. She is not ill-appearing.  HENT:     Head: Normocephalic and atraumatic.     Salivary Glands: Right salivary gland is not diffusely enlarged or tender. Left salivary gland is not diffusely enlarged or tender.     Right Ear: Ear canal and external ear normal. No drainage. A middle ear effusion is present. There is no impacted cerumen. Tympanic membrane is bulging. Tympanic membrane is not injected or erythematous.     Left Ear: Ear canal and external ear normal. No drainage. A middle ear effusion is present. There is no impacted cerumen. Tympanic membrane is bulging. Tympanic membrane is not injected or erythematous.     Ears:     Comments: Bilateral EACs normal, both TMs bulging with clear fluid    Nose: Rhinorrhea present. No nasal deformity, septal deviation, signs of injury, nasal tenderness, mucosal edema or congestion. Rhinorrhea is clear.     Right Nostril: Occlusion present. No foreign body, epistaxis or septal hematoma.     Left Nostril: Occlusion present. No foreign body, epistaxis or septal hematoma.     Right Turbinates: Enlarged, swollen and pale.     Left Turbinates: Enlarged, swollen and pale.     Right Sinus: No maxillary sinus tenderness or frontal sinus tenderness.     Left Sinus: No maxillary sinus tenderness or frontal sinus tenderness.     Mouth/Throat:     Lips: Pink. No lesions.     Mouth: Mucous membranes are moist. No oral lesions.     Pharynx: Oropharynx is clear. Uvula midline. No  posterior oropharyngeal erythema or uvula swelling.     Tonsils: No tonsillar exudate. 0 on the right. 0 on the left.     Comments: Postnasal drip Eyes:     General: Lids are normal.        Right eye: No discharge.  Left eye: No discharge.     Extraocular Movements: Extraocular movements intact.     Conjunctiva/sclera: Conjunctivae normal.     Right eye: Right conjunctiva is not injected.     Left eye: Left conjunctiva is not injected.  Neck:     Trachea: Trachea and phonation normal.  Cardiovascular:     Rate and Rhythm: Normal rate and regular rhythm.     Pulses: Normal pulses.     Heart sounds: Normal heart sounds. No murmur heard.    No friction rub. No gallop.  Pulmonary:     Effort: Pulmonary effort is normal. No accessory muscle usage, prolonged expiration or respiratory distress.     Breath sounds: Normal breath sounds. No stridor, decreased air movement or transmitted upper airway sounds. No decreased breath sounds, wheezing, rhonchi or rales.  Chest:     Chest wall: No tenderness.  Musculoskeletal:        General: Normal range of motion.     Cervical back: Normal range of motion and neck supple. Normal range of motion.  Lymphadenopathy:     Cervical: No cervical adenopathy.  Skin:    General: Skin is warm and dry.     Findings: No erythema or rash.  Neurological:     General: No focal deficit present.     Mental Status: She is alert and oriented to person, place, and time.  Psychiatric:        Mood and Affect: Mood normal.        Behavior: Behavior normal.     Visual Acuity Right Eye Distance:   Left Eye Distance:   Bilateral Distance:    Right Eye Near:   Left Eye Near:    Bilateral Near:     [facility] Couse / Diagnostics / Procedures:     Radiology No results found.  Procedures Procedures (including critical care time) EKG  Pending results:  Labs Reviewed - No data to display  Medications Ordered in UC: Medications - No data to display  UC  Diagnoses / Final Clinical Impressions(s)   I have reviewed the triage vital signs and the nursing notes.  Pertinent labs & imaging results that were available during my care of the patient were reviewed by me and considered in my medical decision making (see chart for details).    Final diagnoses:  Rhinosinusitis  Post-viral cough syndrome   Patient advised she is likely suffering from inflammation post COVID-19 infection and that I recommend nonsteroidal anti-inflammatories, nasal steroid and antihistamine to help resolve her inflammation.  Prescription provided.  Return precautions advised.  ED Prescriptions     Medication Sig Dispense Auth. Provider   cetirizine (ZYRTEC ALLERGY) 10 MG tablet Take 1 tablet (10 mg total) by mouth at bedtime. 90 tablet Lynden Oxford Scales, PA-C   fluticasone (FLONASE) 50 MCG/ACT nasal spray Place 1 spray into both nostrils daily. Begin by using 2 sprays in each nare daily for 3 to 5 days, then decrease to 1 spray in each nare daily. 15.8 mL Lynden Oxford Scales, PA-C   ibuprofen (ADVIL) 400 MG tablet Take 1 tablet (400 mg total) by mouth every 8 (eight) hours as needed for up to 30 doses. 30 tablet Lynden Oxford Scales, PA-C      PDMP not reviewed this encounter.  Disposition Upon Discharge:  Condition: stable for discharge home Home: take medications as prescribed; routine discharge instructions as discussed; follow up as advised.  Patient presented with an acute illness with associated systemic symptoms and  significant discomfort requiring urgent management. In my opinion, this is a condition that a prudent lay person (someone who possesses an average knowledge of health and medicine) may potentially expect to result in complications if not addressed urgently such as respiratory distress, impairment of bodily function or dysfunction of bodily organs.   Routine symptom specific, illness specific and/or disease specific instructions were discussed  with the patient and/or caregiver at length.   As such, the patient has been evaluated and assessed, work-up was performed and treatment was provided in alignment with urgent care protocols and evidence based medicine.  Patient/parent/caregiver has been advised that the patient may require follow up for further testing and treatment if the symptoms continue in spite of treatment, as clinically indicated and appropriate.  If the patient was tested for COVID-19, Influenza and/or RSV, then the patient/parent/guardian was advised to isolate at home pending the results of his/her diagnostic coronavirus test and potentially longer if they're positive. I have also advised pt that if his/her COVID-19 test returns positive, it's recommended to self-isolate for at least 10 days after symptoms first appeared AND until fever-free for 24 hours without fever reducer AND other symptoms have improved or resolved. Discussed self-isolation recommendations as well as instructions for household member/close contacts as per the [facility] and [place] [facility], and also gave patient the [facility] packet with this information.  Patient/parent/caregiver has been advised to return to the [facility] or PCP in 3-5 days if no better; to PCP or the Emergency Department if new signs and symptoms develop, or if the current signs or symptoms continue to change or worsen for further workup, evaluation and treatment as clinically indicated and appropriate  The patient will follow up with their current PCP if and as advised. If the patient does not currently have a PCP we will assist them in obtaining one.   The patient may need specialty follow up if the symptoms continue, in spite of conservative treatment and management, for further workup, evaluation, consultation and treatment as clinically indicated and appropriate.  Patient/parent/caregiver verbalized understanding and agreement of plan as discussed.  All questions were addressed during visit.  Please see  discharge instructions below for further details of plan.  Discharge Instructions:   Discharge Instructions      Your symptoms and my physical exam findings are concerning for postviral inflammation very similar to those experienced by people who suffer from environmental allergies.     Please see the list below for recommended medications, dosages and frequencies to provide relief of current symptoms:     Advil, Motrin (ibuprofen): This is a good anti-inflammatory medication which not only addresses aches, pains but also significantly reduces soft tissue inflammation of the upper airways that causes sinus and nasal congestion as well as inflammation of the lower airways which makes you feel like your breathing is constricted or your cough feel tight.  I recommend that you take 400 mg every 8 hours for the next 2 to 3 days.      Zyrtec (cetirizine): This is an excellent second-generation antihistamine that helps to reduce respiratory inflammatory response to environmental allergens.  In some patients, this medication can cause daytime sleepiness so I recommend that you take 1 tablet daily at bedtime.     Flonase (fluticasone): This is a steroid nasal spray that you use once daily, 1 spray in each nare.  This medication does not work well if you decide to use it only used as you feel you need to, it works best  used on a daily basis.  After 3 to 5 days of use, you will notice significant reduction of the inflammation and mucus production that is currently being caused by exposure to allergens, whether seasonal or environmental.  The most common side effect of this medication is nosebleeds.  If you experience a nosebleed, please discontinue use for 1 week, then feel free to resume.  I have provided you with a prescription.    Please continue Zyrtec and Flonase for the next few weeks, at least 1 week after your symptoms have completely resolved.   If you find that you have not had improvement of your  symptoms in the next 5 to 7 days, please follow-up with your primary care provider or return here to urgent care for repeat evaluation and further recommendations.   Thank you for visiting urgent care today.  We appreciate the opportunity to participate in your care.     This office note has been dictated using Museum/gallery curator.  Unfortunately, this method of dictation can sometimes lead to typographical or grammatical errors.  I apologize for your inconvenience in advance if this occurs.  Please do not hesitate to reach out to me if clarification is needed.      Lynden Oxford Scales, PA-C 09/03/22 1338

## 2022-09-03 NOTE — Discharge Instructions (Signed)
Your symptoms and my physical exam findings are concerning for postviral inflammation very similar to those experienced by people who suffer from environmental allergies.     Please see the list below for recommended medications, dosages and frequencies to provide relief of current symptoms:     Advil, Motrin (ibuprofen): This is a good anti-inflammatory medication which not only addresses aches, pains but also significantly reduces soft tissue inflammation of the upper airways that causes sinus and nasal congestion as well as inflammation of the lower airways which makes you feel like your breathing is constricted or your cough feel tight.  I recommend that you take 400 mg every 8 hours for the next 2 to 3 days.      Zyrtec (cetirizine): This is an excellent second-generation antihistamine that helps to reduce respiratory inflammatory response to environmental allergens.  In some patients, this medication can cause daytime sleepiness so I recommend that you take 1 tablet daily at bedtime.     Flonase (fluticasone): This is a steroid nasal spray that you use once daily, 1 spray in each nare.  This medication does not work well if you decide to use it only used as you feel you need to, it works best used on a daily basis.  After 3 to 5 days of use, you will notice significant reduction of the inflammation and mucus production that is currently being caused by exposure to allergens, whether seasonal or environmental.  The most common side effect of this medication is nosebleeds.  If you experience a nosebleed, please discontinue use for 1 week, then feel free to resume.  I have provided you with a prescription.    Please continue Zyrtec and Flonase for the next few weeks, at least 1 week after your symptoms have completely resolved.   If you find that you have not had improvement of your symptoms in the next 5 to 7 days, please follow-up with your primary care provider or return here to urgent care for  repeat evaluation and further recommendations.   Thank you for visiting urgent care today.  We appreciate the opportunity to participate in your care.

## 2022-12-07 ENCOUNTER — Encounter: Payer: Self-pay | Admitting: Nurse Practitioner

## 2022-12-07 ENCOUNTER — Ambulatory Visit (INDEPENDENT_AMBULATORY_CARE_PROVIDER_SITE_OTHER): Payer: Commercial Managed Care - PPO | Admitting: Nurse Practitioner

## 2022-12-07 VITALS — BP 105/71 | HR 92 | Resp 18 | Ht 63.48 in | Wt 153.0 lb

## 2022-12-07 DIAGNOSIS — R7301 Impaired fasting glucose: Secondary | ICD-10-CM | POA: Diagnosis not present

## 2022-12-07 DIAGNOSIS — Z7689 Persons encountering health services in other specified circumstances: Secondary | ICD-10-CM

## 2022-12-07 DIAGNOSIS — M064 Inflammatory polyarthropathy: Secondary | ICD-10-CM

## 2022-12-07 DIAGNOSIS — G629 Polyneuropathy, unspecified: Secondary | ICD-10-CM

## 2022-12-07 DIAGNOSIS — R5383 Other fatigue: Secondary | ICD-10-CM

## 2022-12-07 DIAGNOSIS — Z0000 Encounter for general adult medical examination without abnormal findings: Secondary | ICD-10-CM | POA: Diagnosis not present

## 2022-12-07 DIAGNOSIS — E039 Hypothyroidism, unspecified: Secondary | ICD-10-CM

## 2022-12-07 DIAGNOSIS — E559 Vitamin D deficiency, unspecified: Secondary | ICD-10-CM | POA: Diagnosis not present

## 2022-12-07 NOTE — Progress Notes (Signed)
New Patient Office Visit  Subjective    Patient ID: Kathy Dorsey, female    DOB: Jan 01, 1981  Age: 41 y.o. MRN: [id number]  CC:  Chief Complaint  Patient presents with   Establish Care    HPI Kathy Dorsey presents to establish care The patient moved to this area approximately three years ago.  -has not had labs done since then.  Having nerve and joint pain since 2015.  -has stabbing and shooting pain in the muscles  -has stiff and painful joints.  -currently, the most severe pain is in her hands and feet.  -states that In her feet, the pain gets most severe when she has been stationary for some time. She states that it is difficult to walk when she first stands up. Feels pins and needles  -started feeling a "shock" type pain in between her shoulder blades these last few weeks.  -states that she has had decreased appetite in the last few weeks. Almost never feels hungry  She states that she saw a neurologist in the past  -was told she did show signs of neuropathy  -saw rheumatology in the past. Was told that this could be something auto-immune, but she was never told exactly what it was  Diagnosed with POTS while she was being evaluated by neurology  -positive tilt table test   Outpatient Encounter Medications as of 12/07/2022  Medication Sig   cetirizine (ZYRTEC ALLERGY) 10 MG tablet Take 1 tablet (10 mg total) by mouth at bedtime.   fluticasone (FLONASE) 50 MCG/ACT nasal spray Place 1 spray into both nostrils daily. Begin by using 2 sprays in each nare daily for 3 to 5 days, then decrease to 1 spray in each nare daily.   ibuprofen (ADVIL) 400 MG tablet Take 1 tablet (400 mg total) by mouth every 8 (eight) hours as needed for up to 30 doses.   No facility-administered encounter medications on file as of 12/07/2022.    Past Medical History:  Diagnosis Date   Arthritis    Cough 01/09/2017   Dysphagia    Endometriosis 04/18/2018   Exposure to the flu 01/09/2017   Fibroids 04/18/2018    Jerking movements of extremities 04/15/2015   Maternal age 4+, multigravida, antepartum 02/18/2016   Myalgia 04/15/2015   Myoclonic disorder 04/18/2018   Numbness and tingling of both legs 04/15/2015   Palpitations 01/29/2017   POTS (postural orthostatic tachycardia syndrome)    Shortness of breath dyspnea    Situational anxiety 09/28/2016   SVD (spontaneous vaginal delivery) 03/05/2016    Past Surgical History:  Procedure Laterality Date   ABDOMINAL EXPLORATION SURGERY  02/2014    Family History  Problem Relation Age of Onset   Diabetes Mother    Breast cancer Mother 69   Rheum arthritis Other    Diabetes Maternal Grandmother    Pancreatic cancer Maternal Grandmother    Diabetes Paternal Grandfather    Cancer Paternal Grandfather    Epilepsy Sister    Heart Problems Sister    Liver disease Maternal Grandfather     Social History   Socioeconomic History   Marital status: Married    Spouse name: Not on file   Number of children: Not on file   Years of education: Not on file   Highest education level: Not on file  Occupational History   Not on file  Tobacco Use   Smoking status: Never    Passive exposure: Never   Smokeless tobacco: Never  Vaping Use  Vaping Use: Never used  Substance and Sexual Activity   Alcohol use: No   Drug use: No   Sexual activity: Yes    Birth control/protection: None  Other Topics Concern   Not on file  Social History Narrative   Lives with husband and 2 sons.  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   Social Determinants of Health   Financial Resource Strain: Not on file  Food Insecurity: Not on file  Transportation Needs: Not on file  Physical Activity: Not on file  Stress: Not on file  Social Connections: Not on file  Intimate Partner Violence: Not on file    Review of Systems  Constitutional:  Positive for malaise/fatigue. Negative for chills and fever.  HENT:  Negative for congestion, sinus pain and sore throat.   Eyes:  Negative.   Respiratory:  Negative for cough, shortness of breath and wheezing.   Cardiovascular:  Negative for chest pain, palpitations and leg swelling.  Gastrointestinal:  Negative for constipation, diarrhea, nausea and vomiting.  Genitourinary: Negative.   Musculoskeletal:  Positive for back pain, joint pain, myalgias and neck pain.  Skin: Negative.   Neurological:  Positive for tingling, sensory change and headaches. Negative for dizziness.  Endo/Heme/Allergies:  Does not bruise/bleed easily.  Psychiatric/Behavioral:  Negative for depression. The patient is not nervous/anxious.         Objective    Today's Vitals   12/07/22 0934  BP: 105/71  Pulse: 92  Resp: 18  SpO2: 97%  Weight: 153 lb (69.4 kg)  Height: 5' 3.48" (1.612 m)  PainSc: 4   PainLoc: Foot   Body mass index is 26.69 kg/m.   Physical Exam Vitals and nursing note reviewed.  Constitutional:      Appearance: Normal appearance. She is well-developed.  HENT:     Head: Normocephalic and atraumatic.  Eyes:     Pupils: Pupils are equal, round, and reactive to light.  Cardiovascular:     Rate and Rhythm: Normal rate and regular rhythm.     Pulses: Normal pulses.     Heart sounds: Normal heart sounds.  Pulmonary:     Effort: Pulmonary effort is normal.     Breath sounds: Normal breath sounds.  Abdominal:     Palpations: Abdomen is soft.  Musculoskeletal:        General: Normal range of motion.     Cervical back: Normal range of motion and neck supple.  Lymphadenopathy:     Cervical: No cervical adenopathy.  Skin:    General: Skin is warm and dry.     Capillary Refill: Capillary refill takes less than 2 seconds.  Neurological:     General: No focal deficit present.     Mental Status: She is alert and oriented to person, place, and time.  Psychiatric:        Mood and Affect: Mood normal.        Behavior: Behavior normal.        Thought Content: Thought content normal.        Judgment: Judgment  normal.         Assessment & Plan:  1. Inflammatory polyarthropathy (HCC) Check connective tissue panel. Consider referral to rheumatology and/or neurology for further evaluation  - ANA w/Reflex if Positive - Sedimentation rate - Rheumatoid Factor  2. Polyneuropathy Check connective tissue panel along with B12 level. Consider referral to rheumatology and/or neurology for further evaluation  - ANA w/Reflex if Positive - Sedimentation rate - Rheumatoid  Factor - B12 and Folate Panel  3. Acquired hypothyroidism Check thyroid panel along with thyroid auto-antibodies for further evaluation.  - TSH + free T4 - Thyroglobulin Level - Thyroid peroxidase antibody  4. Other fatigue Labs drawn during today visit for further evaluation.  - ANA w/Reflex if Positive - Sedimentation rate - Rheumatoid Factor - Comprehensive metabolic panel - CBC - Y63 and Folate Panel  5. Vitamin D deficiency Check vitamin d level and treat deficiency as indicated.   - VITAMIN D 25 Hydroxy (Vit-D Deficiency, Fractures)  6. Impaired fasting glucose Check HgbA1c today  - Hemoglobin A1c  7. Healthcare maintenance Routine, fasting labs drawn during today's visit., - Hemoglobin A1c - Lipid panel - Comprehensive metabolic panel - CBC  8. Encounter to establish care Appointment today to establish new primary care provider     Problem List Items Addressed This Visit       Endocrine   Acquired hypothyroidism   Relevant Orders   TSH + free T4   Thyroglobulin Level   Thyroid peroxidase antibody   Impaired fasting glucose   Relevant Orders   Hemoglobin A1c     Nervous and Auditory   Polyneuropathy   Relevant Orders   ANA w/Reflex if Positive   Sedimentation rate   Rheumatoid Factor   B12 and Folate Panel     Musculoskeletal and Integument   Inflammatory polyarthropathy (HCC) - Primary   Relevant Orders   ANA w/Reflex if Positive   Sedimentation rate   Rheumatoid Factor     Other    Other fatigue   Relevant Orders   ANA w/Reflex if Positive   Sedimentation rate   Rheumatoid Factor   Comprehensive metabolic panel   CBC   Z85 and Folate Panel   Vitamin D deficiency   Relevant Orders   VITAMIN D 25 Hydroxy (Vit-D Deficiency, Fractures)   Other Visit Diagnoses     Healthcare maintenance       Relevant Orders   Hemoglobin A1c   Lipid panel   Comprehensive metabolic panel   CBC   Encounter to establish care           Return in about 6 weeks (around 01/18/2023) for joint pain. please schedule for CPE in 4 months also. thanks .   Ronnell Freshwater, NP

## 2022-12-13 ENCOUNTER — Other Ambulatory Visit: Payer: Self-pay | Admitting: Nurse Practitioner

## 2022-12-13 ENCOUNTER — Encounter: Payer: Self-pay | Admitting: Nurse Practitioner

## 2022-12-13 DIAGNOSIS — E559 Vitamin D deficiency, unspecified: Secondary | ICD-10-CM

## 2022-12-13 MED ORDER — ERGOCALCIFEROL 1.25 MG (50000 UT) PO CAPS: 50000.0000 [IU] | ORAL_CAPSULE | ORAL | 5 refills | Status: DC

## 2022-12-13 NOTE — Progress Notes (Signed)
MyChart message sent to patient -   ignificant vitamin d deficiency. I have made a new prescription for drisdol which is strong vitamin d. This is 50000 iu and taken weekly for the next 6 months. After that, I will recommend over the counter supplement.  The other abnormality is your Hemoglobin A1c. It is slightly elevated at 5.7. this means blood sugar is running around 100 or a little elevated. I recommend that you limit sweets and carbohydrates and increase your water intake. All other labs were good.

## 2022-12-14 LAB — COMPREHENSIVE METABOLIC PANEL
ALT: 9 IU/L (ref 0–32)
AST: 13 IU/L (ref 0–40)
Albumin/Globulin Ratio: 1.8 (ref 1.2–2.2)
Albumin: 4.2 g/dL (ref 3.9–4.9)
Alkaline Phosphatase: 57 IU/L (ref 44–121)
BUN/Creatinine Ratio: 10 (ref 9–23)
BUN: 9 mg/dL (ref 6–24)
Bilirubin Total: 0.3 mg/dL (ref 0.0–1.2)
CO2: 23 mmol/L (ref 20–29)
Calcium: 9 mg/dL (ref 8.7–10.2)
Chloride: 102 mmol/L (ref 96–106)
Creatinine, Ser: 0.88 mg/dL (ref 0.57–1.00)
Globulin, Total: 2.3 g/dL (ref 1.5–4.5)
Glucose: 98 mg/dL (ref 70–99)
Potassium: 4.1 mmol/L (ref 3.5–5.2)
Sodium: 137 mmol/L (ref 134–144)
Total Protein: 6.5 g/dL (ref 6.0–8.5)
eGFR: 85 mL/min/{1.73_m2} (ref >59)

## 2022-12-14 LAB — THYROGLOBULIN LEVEL: Thyroglobulin (TG-RIA): 17 ng/mL

## 2022-12-14 LAB — HEMOGLOBIN A1C
Est. average glucose Bld gHb Est-mCnc: 117 mg/dL
Hgb A1c MFr Bld: 5.7 % — ABNORMAL HIGH (ref 4.8–5.6)

## 2022-12-14 LAB — CBC
Hematocrit: 39.4 % (ref 34.0–46.6)
Hemoglobin: 12.8 g/dL (ref 11.1–15.9)
MCH: 28.1 pg (ref 26.6–33.0)
MCHC: 32.5 g/dL (ref 31.5–35.7)
MCV: 87 fL (ref 79–97)
Platelets: 342 10*3/uL (ref 150–450)
RBC: 4.55 x10E6/uL (ref 3.77–5.28)
RDW: 13.1 % (ref 11.7–15.4)
WBC: 6.4 10*3/uL (ref 3.4–10.8)

## 2022-12-14 LAB — RHEUMATOID FACTOR: Rheumatoid fact SerPl-aCnc: <10 IU/mL (ref <14.0)

## 2022-12-14 LAB — TSH+FREE T4
Free T4: 1.23 ng/dL (ref 0.82–1.77)
TSH: 2.83 u[IU]/mL (ref 0.450–4.500)

## 2022-12-14 LAB — LIPID PANEL
Chol/HDL Ratio: 2.5 ratio (ref 0.0–4.4)
Cholesterol, Total: 179 mg/dL (ref 100–199)
HDL: 72 mg/dL (ref >39)
LDL Chol Calc (NIH): 95 mg/dL (ref 0–99)
Triglycerides: 64 mg/dL (ref 0–149)
VLDL Cholesterol Cal: 12 mg/dL (ref 5–40)

## 2022-12-14 LAB — ANA W/REFLEX IF POSITIVE: Anti Nuclear Antibody (ANA): NEGATIVE

## 2022-12-14 LAB — THYROID PEROXIDASE ANTIBODY: Thyroperoxidase Ab SerPl-aCnc: 17 IU/mL (ref 0–34)

## 2022-12-14 LAB — B12 AND FOLATE PANEL
Folate: 14 ng/mL (ref >3.0)
Vitamin B-12: 444 pg/mL (ref 232–1245)

## 2022-12-14 LAB — SEDIMENTATION RATE: Sed Rate: 6 mm/hr (ref 0–32)

## 2022-12-14 LAB — VITAMIN D 25 HYDROXY (VIT D DEFICIENCY, FRACTURES): Vit D, 25-Hydroxy: 8.7 ng/mL — ABNORMAL LOW (ref 30.0–100.0)

## 2023-01-22 ENCOUNTER — Telehealth (INDEPENDENT_AMBULATORY_CARE_PROVIDER_SITE_OTHER): Payer: Commercial Managed Care - PPO | Admitting: Nurse Practitioner

## 2023-01-22 ENCOUNTER — Encounter: Payer: Self-pay | Admitting: Nurse Practitioner

## 2023-01-22 VITALS — Ht 63.48 in | Wt 148.0 lb

## 2023-01-22 DIAGNOSIS — Z5329 Procedure and treatment not carried out because of patient's decision for other reasons: Secondary | ICD-10-CM

## 2023-01-22 DIAGNOSIS — G629 Polyneuropathy, unspecified: Secondary | ICD-10-CM

## 2023-01-22 DIAGNOSIS — M064 Inflammatory polyarthropathy: Secondary | ICD-10-CM

## 2023-01-22 MED ORDER — DULOXETINE HCL 20 MG PO CPEP: 20.0000 mg | ORAL_CAPSULE | Freq: Every day | ORAL | 3 refills | Status: DC

## 2023-01-22 NOTE — Progress Notes (Signed)
Established patient visit  Virtual Visit via Telephone Note  I connected with Kathy Dorsey on 01/22/23 at 10:50 AM EST by telephone and verified that I am speaking with the correct person using two identifiers.  Location: Patient: home  Provider: [place] [facility]     I discussed the limitations, risks, security and privacy concerns of performing an evaluation and management service by telephone and the availability of in person appointments. I also discussed with the patient that there may be a patient responsible charge related to this service. The patient expressed understanding and agreed to proceed.   I discussed the assessment and treatment plan with the patient. The patient was provided an opportunity to ask questions and all were answered. The patient agreed with the plan and demonstrated an understanding of the instructions.   The patient was advised to call back or seek an in-person evaluation if the symptoms worsen or if the condition fails to improve as anticipated.  I provided *** minutes of non-face-to-face time during this encounter.   Ronnell Freshwater, NP  Patient: Kathy Dorsey   DOB: 04/08/1981   42 y.o. Female  MRN: [id number] Visit Date: 01/22/2023   Chief Complaint  Patient presents with   Follow-up   Subjective    HPI  Follow up  Having nerve and joint pain since 2015.  -has stabbing and shooting pain in the muscles  -has stiff and painful joints.  -currently, the most severe pain is in her hands, feet, and back.  Effecting daily living.  -states that In her feet, the pain gets most severe when she has been stationary for some time. She states that it is difficult to walk when she first stands up. Feels pins and needles  -started feeling a "shock" type pain in between her shoulder blades these last few weeks.  -states that she has had decreased appetite in the last few weeks. Almost never feels hungry  She states that she saw a  neurologist in the past  -was told she did show signs of neuropathy  -saw rheumatology in the past. Was told that this could be something auto-immune, but she was never told exactly what it was  Diagnosed with POTS while she was being evaluated by neurology  -positive tilt table test      Medications: Outpatient Medications Prior to Visit  Medication Sig   ergocalciferol (DRISDOL) 1.25 MG (50000 UT) capsule Take 1 capsule (50,000 Units total) by mouth once a week.   [DISCONTINUED] cetirizine (ZYRTEC ALLERGY) 10 MG tablet Take 1 tablet (10 mg total) by mouth at bedtime.   [DISCONTINUED] fluticasone (FLONASE) 50 MCG/ACT nasal spray Place 1 spray into both nostrils daily. Begin by using 2 sprays in each nare daily for 3 to 5 days, then decrease to 1 spray in each nare daily.   [DISCONTINUED] ibuprofen (ADVIL) 400 MG tablet Take 1 tablet (400 mg total) by mouth every 8 (eight) hours as needed for up to 30 doses.   No facility-administered medications prior to visit.    Review of Systems  Last CBC Lab Results  Component Value Date   WBC 6.4 12/07/2022   HGB 12.8 12/07/2022   HCT 39.4 12/07/2022   MCV 87 12/07/2022   MCH 28.1 12/07/2022   RDW 13.1 12/07/2022   PLT 342 [id number]   Last metabolic panel Lab Results  Component Value Date   GLUCOSE 98 12/07/2022   NA 137 12/07/2022   K 4.1 12/07/2022  CL 102 12/07/2022   CO2 23 12/07/2022   BUN 9 12/07/2022   CREATININE 0.88 12/07/2022   EGFR 85 12/07/2022   CALCIUM 9.0 12/07/2022   PROT 6.5 12/07/2022   ALBUMIN 4.2 12/07/2022   LABGLOB 2.3 12/07/2022   AGRATIO 1.8 12/07/2022   BILITOT 0.3 12/07/2022   ALKPHOS 57 12/07/2022   AST 13 12/07/2022   ALT 9 12/07/2022   ANIONGAP 8 04/11/2018   Last lipids Lab Results  Component Value Date   CHOL 179 12/07/2022   HDL 72 12/07/2022   LDLCALC 95 12/07/2022   TRIG 64 12/07/2022   CHOLHDL 2.5 12/07/2022   Last hemoglobin A1c Lab Results  Component Value Date   HGBA1C 5.7  (H) 12/07/2022   Last thyroid functions Lab Results  Component Value Date   TSH 2.830 12/07/2022   Last vitamin D Lab Results  Component Value Date   VD25OH 8.7 (L) 12/07/2022       Objective     Today's Vitals   01/22/23 0948  Weight: 148 lb (67.1 kg)  Height: 5' 3.48" (1.612 m)   Body mass index is 25.82 kg/m.  BP Readings from Last 3 Encounters:  12/07/22 105/71  09/03/22 104/72  01/30/22 120/78    Wt Readings from Last 3 Encounters:  01/22/23 148 lb (67.1 kg)  12/07/22 153 lb (69.4 kg)  01/30/22 150 lb (68 kg)    Physical Exam  ***  No results found for any visits on 01/22/23.  Assessment & Plan     Problem List Items Addressed This Visit       Nervous and Auditory   Polyneuropathy   Relevant Medications   DULoxetine (CYMBALTA) 20 MG capsule   Other Relevant Orders   Ambulatory referral to Rheumatology   Ambulatory referral to Neurology     Musculoskeletal and Integument   Inflammatory polyarthropathy ([place]) - Primary   Relevant Medications   DULoxetine (CYMBALTA) 20 MG capsule   Other Relevant Orders   Ambulatory referral to Rheumatology     Return in about 3 weeks (around 02/12/2023) for mood - started duloxetine. can be 3-4 weeks .         Ronnell Freshwater, NP  [facility] at [facility] [phone] (phone) [phone] (fax)  [place]

## 2023-03-20 ENCOUNTER — Encounter: Payer: Self-pay | Admitting: Nurse Practitioner

## 2023-03-26 ENCOUNTER — Other Ambulatory Visit: Payer: Self-pay | Admitting: Nurse Practitioner

## 2023-03-26 DIAGNOSIS — M064 Inflammatory polyarthropathy: Secondary | ICD-10-CM

## 2023-03-26 DIAGNOSIS — G629 Polyneuropathy, unspecified: Secondary | ICD-10-CM

## 2023-03-26 MED ORDER — DULOXETINE HCL 20 MG PO CPEP: 40.0000 mg | ORAL_CAPSULE | Freq: Every day | ORAL | 3 refills | Status: DC

## 2023-04-20 ENCOUNTER — Encounter: Payer: Commercial Managed Care - PPO | Admitting: Nurse Practitioner

## 2023-05-24 ENCOUNTER — Ambulatory Visit (INDEPENDENT_AMBULATORY_CARE_PROVIDER_SITE_OTHER): Payer: Self-pay | Admitting: Nurse Practitioner

## 2023-05-24 DIAGNOSIS — Z91199 Patient's noncompliance with other medical treatment and regimen due to unspecified reason: Secondary | ICD-10-CM

## 2023-05-24 NOTE — Progress Notes (Signed)
Complete physical exam   Patient: Kathy Dorsey   DOB: February 06, 1981   42 y.o. Female  MRN: [id number] Visit Date: 05/24/2023    No chief complaint on file.  Subjective    Kathy Dorsey is a 42 y.o. female who presents today for a complete physical exam.  She reports consuming a  diet.  She generally feels . She  have additional problems to discuss today.   HPI  Annual physical  GAD -on cymbalta 20 mg daily  Vitamin d defficiency --recent Vitamin d 8.7 -currently on Drisdol weekly. --may new refill -?GYN provider.    Past Medical History:  Diagnosis Date   Arthritis    Cough 01/09/2017   Dysphagia    Endometriosis 04/18/2018   Exposure to the flu 01/09/2017   Fibroids 04/18/2018   Jerking movements of extremities 04/15/2015   Maternal age 74+, multigravida, antepartum 02/18/2016   Myalgia 04/15/2015   Myoclonic disorder 04/18/2018   Numbness and tingling of both legs 04/15/2015   Palpitations 01/29/2017   POTS (postural orthostatic tachycardia syndrome)    Shortness of breath dyspnea    Situational anxiety 09/28/2016   SVD (spontaneous vaginal delivery) 03/05/2016   Past Surgical History:  Procedure Laterality Date   ABDOMINAL EXPLORATION SURGERY  02/2014   Social History   Socioeconomic History   Marital status: Married    Spouse name: Not on file   Number of children: Not on file   Years of education: Not on file   Highest education level: Not on file  Occupational History   Not on file  Tobacco Use   Smoking status: Never    Passive exposure: Never   Smokeless tobacco: Never  Vaping Use   Vaping Use: Never used  Substance and Sexual Activity   Alcohol use: No   Drug use: No   Sexual activity: Yes    Birth control/protection: None  Other Topics Concern   Not on file  Social History Narrative   Lives with husband and 2 sons.  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   Social Determinants of Health   Financial Resource Strain: Not on file  Food Insecurity: Not on file   Transportation Needs: Not on file  Physical Activity: Not on file  Stress: Not on file  Social Connections: Not on file  Intimate Partner Violence: Not on file   Family Status  Relation Name Status   Mother  Alive   Father  Alive   Sister  Alive   Other  (Not Specified)   MGM  (Not Specified)   PGF  (Not Specified)   Sister  (Not Specified)   Sister  (Not Specified)   MGF  (Not Specified)   Family History  Problem Relation Age of Onset   Diabetes Mother    Breast cancer Mother 23   Rheum arthritis Other    Diabetes Maternal Grandmother    Pancreatic cancer Maternal Grandmother    Diabetes Paternal Grandfather    Cancer Paternal Grandfather    Epilepsy Sister    Heart Problems Sister    Liver disease Maternal Grandfather    Allergies  Allergen Reactions   Lisdexamfetamine Palpitations   Prednisone Palpitations and Other (See Comments)    Reaction:  Joint pain     Patient Care Team: Carlean Jews, NP as PCP - General (Family Medicine)   Medications: Outpatient Medications Prior to Visit  Medication Sig   DULoxetine (CYMBALTA) 20 MG capsule Take 2 capsules (40 mg total)  by mouth daily.   ergocalciferol (DRISDOL) 1.25 MG (50000 UT) capsule Take 1 capsule (50,000 Units total) by mouth once a week.   No facility-administered medications prior to visit.    Review of Systems  Last CBC Lab Results  Component Value Date   WBC 6.4 12/07/2022   HGB 12.8 12/07/2022   HCT 39.4 12/07/2022   MCV 87 12/07/2022   MCH 28.1 12/07/2022   RDW 13.1 12/07/2022   PLT 342 12/07/2022   Last metabolic panel Lab Results  Component Value Date   GLUCOSE 98 12/07/2022   NA 137 12/07/2022   K 4.1 12/07/2022   CL 102 12/07/2022   CO2 23 12/07/2022   BUN 9 12/07/2022   CREATININE 0.88 12/07/2022   EGFR 85 12/07/2022   CALCIUM 9.0 12/07/2022   PROT 6.5 12/07/2022   ALBUMIN 4.2 12/07/2022   LABGLOB 2.3 12/07/2022   AGRATIO 1.8 12/07/2022   BILITOT 0.3 12/07/2022    ALKPHOS 57 12/07/2022   AST 13 12/07/2022   ALT 9 12/07/2022   ANIONGAP 8 04/11/2018   Last lipids Lab Results  Component Value Date   CHOL 179 12/07/2022   HDL 72 12/07/2022   LDLCALC 95 12/07/2022   TRIG 64 12/07/2022   CHOLHDL 2.5 12/07/2022   Last hemoglobin A1c Lab Results  Component Value Date   HGBA1C 5.7 (H) 12/07/2022   Last thyroid functions Lab Results  Component Value Date   TSH 2.830 12/07/2022   Last vitamin D Lab Results  Component Value Date   VD25OH 8.7 (L) 12/07/2022   Last vitamin B12 and Folate Lab Results  Component Value Date   VITAMINB12 444 12/07/2022   FOLATE 14.0 12/07/2022       Objective    There were no vitals filed for this visit. There is no height or weight on file to calculate BMI.  BP Readings from Last 3 Encounters:  12/07/22 105/71  09/03/22 104/72  01/30/22 120/78    Wt Readings from Last 3 Encounters:  01/22/23 148 lb (67.1 kg)  12/07/22 153 lb (69.4 kg)  01/30/22 150 lb (68 kg)     Physical Exam    Last depression screening scores   Row Labels 01/22/2023    9:51 AM 12/07/2022   10:13 AM  PHQ 2/9 Scores   Section Header. No data exists in this row.    PHQ - 2 Score   1 1  PHQ- 9 Score   4 9   Last fall risk screening   Row Labels 01/22/2023    9:54 AM  Fall Risk    Section Header. No data exists in this row.   Falls in the past year?   0  Number falls in past yr:   0  Injury with Fall?   0  Follow up   Falls evaluation completed   Last Audit-C alcohol use screening   No data to display    A score of 3 or more in women, and 4 or more in men indicates increased risk for alcohol abuse, EXCEPT if all of the points are from question 1   No results found for any visits on 05/24/23.  Assessment & Plan    Routine Health Maintenance and Physical Exam  Exercise Activities and Dietary recommendations  Goals   None     Immunization History  Administered Date(s) Administered   Influenza-Unspecified  11/22/2013, 12/07/2015   PFIZER(Purple Top)SARS-COV-2 Vaccination 02/23/2020, 03/17/2020, 04/30/2021   Tdap 03/06/2016, 04/29/2019  Health Maintenance  Topic Date Due   Hepatitis C Screening  Never done   PAP SMEAR-Modifier  07/11/2018   COVID-19 Vaccine (4 - 2023-24 season) 08/11/2022   INFLUENZA VACCINE  07/12/2023   DTaP/Tdap/Td (3 - Td or Tdap) 04/28/2029   HIV Screening  Completed   HPV VACCINES  Aged Out    Discussed health benefits of physical activity, and encouraged her to engage in regular exercise appropriate for her age and condition.  No-show for appointment    Return in about 6 months (around 11/23/2023) for mood, FBW a week prior to visit.        Carlean Jews, NP  [facility] at [facility] [phone] (phone) [phone] (fax)  [facility]

## 2023-07-05 NOTE — Progress Notes (Deleted)
Office Visit Note  Patient: Kathy Dorsey             Date of Birth: 1981/04/28           MRN: [id number]             PCP: Carlean Jews, NP Referring: Carlean Jews, NP Visit Date: 07/19/2023 Occupation: @GUAROCC @  Subjective:  No chief complaint on file.   History of Present Illness: Kathy Dorsey is a 42 y.o. female ***     Activities of Daily Living:  Patient reports morning stiffness for *** {minute/hour:19697}.   Patient {ACTIONS;DENIES/REPORTS:21021675::"Denies"} nocturnal pain.  Difficulty dressing/grooming: {ACTIONS;DENIES/REPORTS:21021675::"Denies"} Difficulty climbing stairs: {ACTIONS;DENIES/REPORTS:21021675::"Denies"} Difficulty getting out of chair: {ACTIONS;DENIES/REPORTS:21021675::"Denies"} Difficulty using hands for taps, buttons, cutlery, and/or writing: {ACTIONS;DENIES/REPORTS:21021675::"Denies"}  No Rheumatology ROS completed.   PMFS History:  Patient Active Problem List   Diagnosis Date Noted   Inflammatory polyarthropathy (HCC) 12/07/2022   Polyneuropathy 12/07/2022   Acquired hypothyroidism 12/07/2022   Other fatigue 12/07/2022   Vitamin D deficiency 12/07/2022   Impaired fasting glucose 12/07/2022   Preterm labor without delivery, third trimester 06/15/2019   Atypical chest pain 04/19/2018   Endometriosis 04/18/2018   Fibroids 04/18/2018   Myoclonic disorder 04/18/2018   Palpitations 01/29/2017   Cough 01/09/2017   Exposure to the flu 01/09/2017   Situational anxiety 09/28/2016   SVD (spontaneous vaginal delivery) 03/05/2016   Maternal age 41+, multigravida, antepartum 02/18/2016   Jerking movements of extremities 04/15/2015   Myalgia 04/15/2015   Numbness and tingling of both legs 04/15/2015   POTS (postural orthostatic tachycardia syndrome) 02/05/2015    Past Medical History:  Diagnosis Date   Arthritis    Cough 01/09/2017   Dysphagia    Endometriosis 04/18/2018   Exposure to the flu 01/09/2017   Fibroids 04/18/2018   Jerking  movements of extremities 04/15/2015   Maternal age 42+, multigravida, antepartum 02/18/2016   Myalgia 04/15/2015   Myoclonic disorder 04/18/2018   Numbness and tingling of both legs 04/15/2015   Palpitations 01/29/2017   POTS (postural orthostatic tachycardia syndrome)    Shortness of breath dyspnea    Situational anxiety 09/28/2016   SVD (spontaneous vaginal delivery) 03/05/2016    Family History  Problem Relation Age of Onset   Diabetes Mother    Breast cancer Mother 51   Rheum arthritis Other    Diabetes Maternal Grandmother    Pancreatic cancer Maternal Grandmother    Diabetes Paternal Grandfather    Cancer Paternal Grandfather    Epilepsy Sister    Heart Problems Sister    Liver disease Maternal Grandfather    Past Surgical History:  Procedure Laterality Date   ABDOMINAL EXPLORATION SURGERY  02/2014   Social History   Social History Narrative   Lives with husband and 2 sons.  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   Immunization History  Administered Date(s) Administered   Influenza-Unspecified 11/22/2013, 12/07/2015   PFIZER(Purple Top)SARS-COV-2 Vaccination 02/23/2020, 03/17/2020, 04/30/2021   Tdap 03/06/2016, 04/29/2019     Objective: Vital Signs: There were no vitals taken for this visit.   Physical Exam   Musculoskeletal Exam: ***  CDAI Exam: CDAI Score: -- Patient Global: --; Provider Global: -- Swollen: --; Tender: -- Joint Exam 07/19/2023   No joint exam has been documented for this visit   There is currently no information documented on the homunculus. Go to the Rheumatology activity and complete the homunculus joint exam.  Investigation: No additional findings.  Imaging: No results found.  Recent Labs: Lab Results  Component Value Date   WBC 6.4 12/07/2022   HGB 12.8 12/07/2022   PLT 342 12/07/2022   NA 137 12/07/2022   K 4.1 12/07/2022   CL 102 12/07/2022   CO2 23 12/07/2022   GLUCOSE 98 12/07/2022   BUN 9 12/07/2022   CREATININE 0.88  12/07/2022   BILITOT 0.3 12/07/2022   ALKPHOS 57 12/07/2022   AST 13 12/07/2022   ALT 9 12/07/2022   PROT 6.5 12/07/2022   ALBUMIN 4.2 12/07/2022   CALCIUM 9.0 12/07/2022   GFRAA 94 02/18/2020    Speciality Comments: No specialty comments available.  Procedures:  No procedures performed Allergies: Lisdexamfetamine and Prednisone   Assessment / Plan:     Visit Diagnoses: Inflammatory polyarthropathy (HCC) - 12/07/22: ANA negative, ESR 6, RF<10, vitamin D 8.7, TSH 2.830, T4 1.23, thyroglobulin 17, TPO-, folate WNL, vitamin B12 WNL  Neuropathy - Referred to neurology  POTS (postural orthostatic tachycardia syndrome)  Acquired hypothyroidism  Myoclonic disorder  Vitamin D deficiency  Endometriosis  Orders: No orders of the defined types were placed in this encounter.  No orders of the defined types were placed in this encounter.   Face-to-face time spent with patient was *** minutes. Greater than 50% of time was spent in counseling and coordination of care.  Follow-Up Instructions: No follow-ups on file.   Gearldine Bienenstock, PA-C  Note - This record has been created using Dragon software.  Chart creation errors have been sought, but may not always  have been located. Such creation errors do not reflect on  the standard of medical care.

## 2023-07-19 ENCOUNTER — Encounter: Payer: Commercial Managed Care - PPO | Admitting: Rheumatology

## 2023-07-19 DIAGNOSIS — M064 Inflammatory polyarthropathy: Secondary | ICD-10-CM

## 2023-07-19 DIAGNOSIS — E039 Hypothyroidism, unspecified: Secondary | ICD-10-CM

## 2023-07-19 DIAGNOSIS — G90A Postural orthostatic tachycardia syndrome (POTS): Secondary | ICD-10-CM

## 2023-07-19 DIAGNOSIS — E559 Vitamin D deficiency, unspecified: Secondary | ICD-10-CM

## 2023-07-19 DIAGNOSIS — N809 Endometriosis, unspecified: Secondary | ICD-10-CM

## 2023-07-19 DIAGNOSIS — G253 Myoclonus: Secondary | ICD-10-CM

## 2023-07-19 DIAGNOSIS — G629 Polyneuropathy, unspecified: Secondary | ICD-10-CM

## 2023-08-16 ENCOUNTER — Ambulatory Visit: Payer: Commercial Managed Care - PPO | Admitting: Rheumatology

## 2023-08-22 ENCOUNTER — Ambulatory Visit: Payer: Commercial Managed Care - PPO | Admitting: Rheumatology

## 2023-09-18 ENCOUNTER — Ambulatory Visit
Admission: RE | Admit: 2023-09-18 | Discharge: 2023-09-18 | Disposition: A | Discharge status: Home / Self Care | Payer: Commercial Managed Care - PPO | Source: Ambulatory Visit

## 2023-09-18 VITALS — BP 105/79 | HR 77 | Temp 98.3°F | Resp 18 | Ht 64.0 in | Wt 158.0 lb

## 2023-09-18 DIAGNOSIS — N912 Amenorrhea, unspecified: Secondary | ICD-10-CM | POA: Insufficient documentation

## 2023-09-18 DIAGNOSIS — R053 Chronic cough: Secondary | ICD-10-CM

## 2023-09-18 DIAGNOSIS — G90A Postural orthostatic tachycardia syndrome (POTS): Secondary | ICD-10-CM | POA: Insufficient documentation

## 2023-09-18 DIAGNOSIS — O35EXX0 Maternal care for other (suspected) fetal abnormality and damage, fetal genitourinary anomalies, not applicable or unspecified: Secondary | ICD-10-CM | POA: Insufficient documentation

## 2023-09-18 DIAGNOSIS — O418X90 Other specified disorders of amniotic fluid and membranes, unspecified trimester, not applicable or unspecified: Secondary | ICD-10-CM | POA: Insufficient documentation

## 2023-09-18 MED ORDER — AZITHROMYCIN 250 MG PO TABS: 250.0000 mg | ORAL_TABLET | Freq: Every day | ORAL | 0 refills | Status: DC

## 2023-09-18 MED ORDER — DOXYCYCLINE HYCLATE 100 MG PO CAPS
100.0000 mg | ORAL_CAPSULE | Freq: Two times a day (BID) | ORAL | 0 refills | Status: AC
Start: 2023-09-18 — End: 2023-09-25

## 2023-09-18 NOTE — ED Provider Notes (Addendum)
[facility]    CSN: [id number] Arrival date & time: 09/18/23  1843      History   Chief Complaint Chief Complaint  Patient presents with   Cough    Appt    HPI Cindee Mclester is a 42 y.o. female.   Patient here today for evaluation of upper respiratory symptoms that she has had for the last 4 weeks. She has taken covid screening that was negative. She has been having coughing "fits". She reports that a few days ago she started pain in her chest secondary to cough. She notes pain is in mid chest and is a dull throbbing ache. She has not had fever. She has tried OTC meds without resolution.   The history is provided by the patient.  Cough Associated symptoms: chest pain and sore throat   Associated symptoms: no chills, no ear pain, no eye discharge, no fever, no shortness of breath and no wheezing     Past Medical History:  Diagnosis Date   Arthritis    Cough 01/09/2017   Dysphagia    Endometriosis 04/18/2018   Exposure to the flu 01/09/2017   Fibroids 04/18/2018   Jerking movements of extremities 04/15/2015   Maternal age 32+, multigravida, antepartum 02/18/2016   Myalgia 04/15/2015   Myoclonic disorder 04/18/2018   Numbness and tingling of both legs 04/15/2015   Palpitations 01/29/2017   POTS (postural orthostatic tachycardia syndrome)    Shortness of breath dyspnea    Situational anxiety 09/28/2016   SVD (spontaneous vaginal delivery) 03/05/2016    Patient Active Problem List   Diagnosis Date Noted   Pyelectasis of fetus on prenatal ultrasound 09/18/2023   Problem involving uterine contractions 09/18/2023   Amenorrhea 09/18/2023   Subchorionic hematoma 09/18/2023   Postural orthostatic tachycardia syndrome 09/18/2023   Inflammatory polyarthropathy (HCC) 12/07/2022   Polyneuropathy 12/07/2022   Acquired hypothyroidism 12/07/2022   Other fatigue 12/07/2022   Vitamin D deficiency 12/07/2022   Impaired fasting glucose 12/07/2022   Preterm labor without delivery,  third trimester 06/15/2019   Morning sickness 12/23/2018   Pregnancy 12/20/2018   Abnormal vaginal bleeding 08/23/2018   Atypical chest pain 04/19/2018   Endometriosis 04/18/2018   Fibroids 04/18/2018   Myoclonic disorder 04/18/2018   Palpitations 01/29/2017   Cough 01/09/2017   Exposure to the flu 01/09/2017   Situational anxiety 09/28/2016   SVD (spontaneous vaginal delivery) 03/05/2016   Maternal age 24+, multigravida, antepartum 02/18/2016   Multigravida of advanced maternal age 34/09/2016   Jerking movements of extremities 04/15/2015   Myalgia 04/15/2015   Numbness and tingling of both legs 04/15/2015   POTS (postural orthostatic tachycardia syndrome) 02/05/2015    Past Surgical History:  Procedure Laterality Date   ABDOMINAL EXPLORATION SURGERY  02/2014    OB History     Gravida  6   Para  4   Term  2   Preterm  2   AB  2   Living  4      SAB  2   IAB      Ectopic  0   Multiple  0   Live Births  4            Home Medications    Prior to Admission medications   Medication Sig Start Date End Date Taking? Authorizing Provider  acetaminophen (TYLENOL) 325 MG tablet Take 325 mg by mouth every 6 (six) hours as needed for mild pain, moderate pain, fever or headache. 01/27/21  Yes [provider]  azithromycin (ZITHROMAX) 250 MG tablet Take 1 tablet (250 mg total) by mouth daily. Take first 2 tablets together, then 1 every day until finished. 09/18/23  Yes Tomi Bamberger, PA-C  doxycycline (VIBRAMYCIN) 100 MG capsule Take 1 capsule (100 mg total) by mouth 2 (two) times daily for 7 days. 09/18/23 09/25/23 Yes Tomi Bamberger, PA-C  hydroxychloroquine (PLAQUENIL) 200 MG tablet Take 300 mg by mouth daily. 12/08/16  Yes [provider]  ibuprofen (ADVIL) 200 MG tablet Take 200 mg by mouth every 6 (six) hours as needed for fever, headache, mild pain, moderate pain or cramping. 01/27/21  Yes [provider]  ibuprofen (ADVIL) 200 MG  tablet Take 400 mg by mouth every 6 (six) hours as needed for mild pain or moderate pain. Last dose: 1600   Yes [provider]  montelukast (SINGULAIR) 10 MG tablet Take 10 mg by mouth at bedtime. 02/01/21  Yes [provider]  DULoxetine (CYMBALTA) 20 MG capsule Take 2 capsules (40 mg total) by mouth daily. 03/26/23   Carlean Jews, NP  ergocalciferol (DRISDOL) 1.25 MG (50000 UT) capsule Take 1 capsule (50,000 Units total) by mouth once a week. 12/13/22   Carlean Jews, NP  norethindrone (NORLYDA) 0.35 MG tablet Take 1 tablet by mouth daily.    [provider]    Family History Family History  Problem Relation Age of Onset   Diabetes Mother    Breast cancer Mother 44   Rheum arthritis Other    Diabetes Maternal Grandmother    Pancreatic cancer Maternal Grandmother    Diabetes Paternal Grandfather    Cancer Paternal Grandfather    Epilepsy Sister    Heart Problems Sister    Liver disease Maternal Grandfather     Social History Social History   Tobacco Use   Smoking status: Never    Passive exposure: Never   Smokeless tobacco: Never  Vaping Use   Vaping status: Never Used  Substance Use Topics   Alcohol use: No   Drug use: Never     Allergies   Lisdexamfetamine and Prednisone   Review of Systems Review of Systems  Constitutional:  Negative for chills and fever.  HENT:  Positive for congestion, sinus pressure and sore throat. Negative for ear pain.   Eyes:  Negative for discharge and redness.  Respiratory:  Positive for cough. Negative for shortness of breath and wheezing.   Cardiovascular:  Positive for chest pain.  Gastrointestinal:  Negative for abdominal pain, diarrhea, nausea and vomiting.     Physical Exam Triage Vital Signs ED Triage Vitals  Encounter Vitals Group     BP 09/18/23 1925 105/79     Systolic BP Percentile --      Diastolic BP Percentile --      Pulse Rate 09/18/23 1925 77     Resp 09/18/23 1925 18     Temp  09/18/23 1925 98.3 F (36.8 C)     Temp Source 09/18/23 1925 Oral     SpO2 09/18/23 1925 99 %     Weight 09/18/23 1923 158 lb (71.7 kg)     Height 09/18/23 1923 5\' 4"  (1.626 m)     Head Circumference --      Peak Flow --      Pain Score 09/18/23 1919 4     Pain Loc --      Pain Education --      Exclude from Growth Chart --    No  data found.  Updated Vital Signs BP 105/79 (BP Location: Left Arm)   Pulse 77   Temp 98.3 F (36.8 C) (Oral)   Resp 18   Ht 5\' 4"  (1.626 m)   Wt 158 lb (71.7 kg)   LMP  (LMP Unknown) Comment: Irregular.  SpO2 99%   BMI 27.12 kg/m    Physical Exam Vitals and nursing note reviewed.  Constitutional:      General: She is not in acute distress.    Appearance: Normal appearance. She is not ill-appearing.  HENT:     Head: Normocephalic and atraumatic.     Nose: Congestion present.     Mouth/Throat:     Mouth: Mucous membranes are moist.     Pharynx: No oropharyngeal exudate or posterior oropharyngeal erythema.  Eyes:     Conjunctiva/sclera: Conjunctivae normal.  Cardiovascular:     Rate and Rhythm: Normal rate and regular rhythm.     Heart sounds: Normal heart sounds. No murmur heard. Pulmonary:     Effort: Pulmonary effort is normal. No respiratory distress.     Breath sounds: Normal breath sounds. No wheezing, rhonchi or rales.  Skin:    General: Skin is warm and dry.  Neurological:     Mental Status: She is alert.  Psychiatric:        Mood and Affect: Mood normal.        Thought Content: Thought content normal.      UC Treatments / Results  Labs (all labs ordered are listed, but only abnormal results are displayed) Labs Reviewed - No data to display  EKG   Radiology No results found.  Procedures Procedures (including critical care time)  Medications Ordered in UC Medications - No data to display  Initial Impression / Assessment and Plan / UC Course  I have reviewed the triage vital signs and the nursing  notes.  Pertinent labs & imaging results that were available during my care of the patient were reviewed by me and considered in my medical decision making (see chart for details).    EKG without concerning findings. Low suspicion that chest discomfort is cardiac related. Unable to perform CXR in office today, will treat to cover both CAP and atypical pna given discomfort in chest and duration of symptoms. Doxycycline and zpak prescribed. Encouraged follow up if no improvement or with any worsening symptoms.   Final Clinical Impressions(s) / UC Diagnoses   Final diagnoses:  Persistent cough   Discharge Instructions   None    ED Prescriptions     Medication Sig Dispense Auth. Provider   doxycycline (VIBRAMYCIN) 100 MG capsule Take 1 capsule (100 mg total) by mouth 2 (two) times daily for 7 days. 14 capsule Erma Pinto F, PA-C   azithromycin (ZITHROMAX) 250 MG tablet Take 1 tablet (250 mg total) by mouth daily. Take first 2 tablets together, then 1 every day until finished. 6 tablet Tomi Bamberger, PA-C      PDMP not reviewed this encounter.   Tomi Bamberger, PA-C 09/24/23 2219    Tomi Bamberger, PA-C 09/24/23 2220

## 2023-09-18 NOTE — ED Triage Notes (Signed)
"  I am going on week 4 of a virus, covid19 test at the time and was negative, I still have the cough, having fits due to this being recurrent, On day 3 of pain in my chest due to this cough, it seems to be a spot in my mid chest that hurts, not like I have had before due to cough, this is a dull throbbing ache in my chest". No recent Fever.

## 2024-03-10 ENCOUNTER — Other Ambulatory Visit: Payer: Self-pay | Admitting: Nurse Practitioner

## 2024-03-10 DIAGNOSIS — M064 Inflammatory polyarthropathy: Secondary | ICD-10-CM

## 2024-03-10 DIAGNOSIS — G629 Polyneuropathy, unspecified: Secondary | ICD-10-CM

## 2024-03-25 ENCOUNTER — Other Ambulatory Visit: Payer: Self-pay | Admitting: Nurse Practitioner

## 2024-03-25 DIAGNOSIS — G629 Polyneuropathy, unspecified: Secondary | ICD-10-CM

## 2024-03-25 DIAGNOSIS — M064 Inflammatory polyarthropathy: Secondary | ICD-10-CM

## 2024-03-25 NOTE — Telephone Encounter (Signed)
 Copied from CRM [id number]. Topic: Clinical - Medication Refill >> Mar 25, 2024 10:25 AM Lorenz Romano B wrote: Most Recent Primary Care Visit:  Provider: Sharyon Deis  Department: [facility]  Visit Type: NEW PATIENT  Date: 12/07/2022  Medication: DULoxetine (CYMBALTA) 20 MG  Has the patient contacted their pharmacy? Yes (Agent: If no, request that the patient contact the pharmacy for the refill. If patient does not wish to contact the pharmacy document the reason why and proceed with request.) (Agent: If yes, when and what did the pharmacy advise?)  Is this the correct pharmacy for this prescription? Yes If no, delete pharmacy and type the correct one.  This is the patient's preferred pharmacy:    [id number] - [place], [place] - [address] [place] [address] Phone: [phone] Fax: [phone]   Has the prescription been filled recently? Yes  Is the patient out of the medication? No  Has the patient been seen for an appointment in the last year OR does the patient have an upcoming appointment? Yes  Can we respond through MyChart? No  Agent: Please be advised that Rx refills may take up to 3 business days. We ask that you follow-up with your pharmacy.

## 2024-04-03 NOTE — Progress Notes (Signed)
   Established Patient Office Visit  Subjective   Patient ID: Kathy Dorsey, female    DOB: [birth year]  Age: 43 y.o. MRN: [id number]  Chief Complaint  Patient presents with   Medical Management of Chronic Issues    HPI Patient location: Home Provider location: [facility] primary care Method of visit: Video Duration of visit: 20 minutes  Patient here for routine follow-up for management of her medication.  Patient is currently taking Cymbalta  40 mg at night.  Tolerating this dose well.  No concerns.  Wants to continue current dose.  Patient takes 2000 units over-the-counter vitamin D3 daily.  Patient's gynecologist is Dr. Donelda Fujita and states she is up-to-date on Pap smears.  The 10-year ASCVD risk score (Arnett DK, et al., 2019) is: 0.2%  Health Maintenance Due  Topic Date Due   Hepatitis C Screening  Never done   Cervical Cancer Screening (HPV/Pap Cotest)  Never done   COVID-19 Vaccine (4 - 2024-25 season) 08/12/2023      Objective:     Ht 5\' 4"  (1.626 m)   Wt 161 lb (73 kg)   BMI 27.64 kg/m    Physical Exam General: Alert, oriented Pulmonary: No respiratory distress.  Speaking in full sentences without difficulty. Psych: Pleasant affect   No results found for any visits on 04/04/24.      Assessment & Plan:   Vitamin D  deficiency Assessment & Plan: Taking 2000 IU daily over-the-counter vitamin D3.  Will check vitamin D  at her next lab visit.  Orders: -     VITAMIN D  25 Hydroxy (Vit-D Deficiency, Fractures); Future  Impaired fasting glucose -     Comprehensive metabolic panel with GFR; Future -     Hemoglobin A1c; Future  Inflammatory polyarthropathy (HCC) -     DULoxetine  HCl; Take 2 capsules (40 mg total) by mouth daily.  Dispense: 180 capsule; Refill: 3  Polyneuropathy -     DULoxetine  HCl; Take 2 capsules (40 mg total) by mouth daily.  Dispense: 180 capsule; Refill: 3     No follow-ups on file.    Laneta Pintos, MD

## 2024-04-04 ENCOUNTER — Telehealth (INDEPENDENT_AMBULATORY_CARE_PROVIDER_SITE_OTHER): Admitting: Family Medicine

## 2024-04-04 VITALS — Ht 64.0 in | Wt 161.0 lb

## 2024-04-04 DIAGNOSIS — E559 Vitamin D deficiency, unspecified: Secondary | ICD-10-CM

## 2024-04-04 DIAGNOSIS — R7301 Impaired fasting glucose: Secondary | ICD-10-CM | POA: Diagnosis not present

## 2024-04-04 DIAGNOSIS — M064 Inflammatory polyarthropathy: Secondary | ICD-10-CM

## 2024-04-04 DIAGNOSIS — G629 Polyneuropathy, unspecified: Secondary | ICD-10-CM

## 2024-04-04 DIAGNOSIS — R4589 Other symptoms and signs involving emotional state: Secondary | ICD-10-CM

## 2024-04-04 MED ORDER — DULOXETINE HCL 20 MG PO CPEP: 40.0000 mg | ORAL_CAPSULE | Freq: Every day | ORAL | 3 refills | Status: DC

## 2024-04-06 DIAGNOSIS — R4589 Other symptoms and signs involving emotional state: Secondary | ICD-10-CM | POA: Insufficient documentation

## 2024-04-06 NOTE — Assessment & Plan Note (Addendum)
 Patient originally started taking duloxetine  for potential pain relief.  Noticed significant improvement with her mood when starting it.  Eventually increased to 40 mg and then has been stable on this dose.  Continue collecting 40 mg nightly.

## 2024-04-06 NOTE — Assessment & Plan Note (Signed)
 Taking 2000 IU daily over-the-counter vitamin D3.  Will check vitamin D  at her next lab visit.

## 2024-04-28 ENCOUNTER — Telehealth: Payer: Self-pay

## 2024-04-28 DIAGNOSIS — N6310 Unspecified lump in the right breast, unspecified quadrant: Secondary | ICD-10-CM

## 2024-04-28 NOTE — Telephone Encounter (Signed)
 Copied from CRM [id number]. Topic: Referral - Request for Referral >> Apr 28, 2024 10:03 AM Essie A wrote: Did the patient discuss referral with their provider in the last year? No (If No - schedule appointment) (If Yes - send message)  Appointment offered? No  Type of order/referral and detailed reason for visit: Mammogram because a lump was discovered  Preference of office, provider, location: Somewhere in [place], [place]  If referral order, have you been seen by this specialty before? No (If Yes, this issue or another issue? When? Where?  Can we respond through MyChart? Yes

## 2024-04-29 NOTE — Telephone Encounter (Signed)
 Can you call the patient and ask if it is left or right sided, and where at on the breast does she feel a lump?

## 2024-04-30 ENCOUNTER — Other Ambulatory Visit

## 2024-04-30 NOTE — Telephone Encounter (Signed)
 Please let pt know I have sent the mammogram order in to the breast center

## 2024-04-30 NOTE — Addendum Note (Signed)
 Addended by: Laneta Pintos on: 04/30/2024 05:52 PM   Modules accepted: Orders

## 2024-05-01 ENCOUNTER — Other Ambulatory Visit: Payer: Self-pay | Admitting: Family Medicine

## 2024-05-01 DIAGNOSIS — N6310 Unspecified lump in the right breast, unspecified quadrant: Secondary | ICD-10-CM

## 2024-05-01 NOTE — Telephone Encounter (Signed)
 Patient has been informed that Dr. Arabella Beach sent [facility].

## 2024-05-09 ENCOUNTER — Other Ambulatory Visit

## 2024-05-16 ENCOUNTER — Ambulatory Visit
Admission: RE | Admit: 2024-05-16 | Discharge: 2024-05-16 | Disposition: A | Discharge status: Home / Self Care | Source: Ambulatory Visit | Attending: Family Medicine | Admitting: Family Medicine

## 2024-05-16 DIAGNOSIS — N6310 Unspecified lump in the right breast, unspecified quadrant: Secondary | ICD-10-CM

## 2024-06-05 ENCOUNTER — Encounter: Admitting: Obstetrics and Gynecology

## 2024-07-18 ENCOUNTER — Ambulatory Visit: Payer: Self-pay | Admitting: Obstetrics and Gynecology

## 2024-07-18 ENCOUNTER — Ambulatory Visit (INDEPENDENT_AMBULATORY_CARE_PROVIDER_SITE_OTHER): Admitting: Obstetrics and Gynecology

## 2024-07-18 ENCOUNTER — Encounter: Payer: Self-pay | Admitting: Obstetrics and Gynecology

## 2024-07-18 ENCOUNTER — Other Ambulatory Visit (HOSPITAL_COMMUNITY)
Admission: RE | Admit: 2024-07-18 | Discharge: 2024-07-18 | Disposition: A | Discharge status: Home / Self Care | Source: Ambulatory Visit | Attending: Obstetrics and Gynecology | Admitting: Obstetrics and Gynecology

## 2024-07-18 VITALS — BP 102/70 | HR 101 | Ht 62.99 in | Wt 169.0 lb

## 2024-07-18 DIAGNOSIS — N921 Excessive and frequent menstruation with irregular cycle: Secondary | ICD-10-CM | POA: Diagnosis not present

## 2024-07-18 DIAGNOSIS — D219 Benign neoplasm of connective and other soft tissue, unspecified: Secondary | ICD-10-CM

## 2024-07-18 DIAGNOSIS — N946 Dysmenorrhea, unspecified: Secondary | ICD-10-CM | POA: Diagnosis not present

## 2024-07-18 DIAGNOSIS — N809 Endometriosis, unspecified: Secondary | ICD-10-CM

## 2024-07-18 DIAGNOSIS — N3941 Urge incontinence: Secondary | ICD-10-CM

## 2024-07-18 DIAGNOSIS — N938 Other specified abnormal uterine and vaginal bleeding: Secondary | ICD-10-CM

## 2024-07-18 DIAGNOSIS — N8110 Cystocele, unspecified: Secondary | ICD-10-CM | POA: Diagnosis not present

## 2024-07-18 DIAGNOSIS — Z01419 Encounter for gynecological examination (general) (routine) without abnormal findings: Secondary | ICD-10-CM | POA: Insufficient documentation

## 2024-07-18 DIAGNOSIS — N898 Other specified noninflammatory disorders of vagina: Secondary | ICD-10-CM

## 2024-07-18 DIAGNOSIS — R350 Frequency of micturition: Secondary | ICD-10-CM | POA: Diagnosis not present

## 2024-07-18 DIAGNOSIS — R198 Other specified symptoms and signs involving the digestive system and abdomen: Secondary | ICD-10-CM

## 2024-07-18 DIAGNOSIS — R3913 Splitting of urinary stream: Secondary | ICD-10-CM

## 2024-07-18 DIAGNOSIS — N393 Stress incontinence (female) (male): Secondary | ICD-10-CM

## 2024-07-18 DIAGNOSIS — N8003 Adenomyosis of the uterus: Secondary | ICD-10-CM

## 2024-07-18 NOTE — Progress Notes (Signed)
 43 y.o. y.o. female here for new GYN annual exam.  G4P4 4 svd all boys. Largest 6 lbs  [name] was 07/04/2024 (within days). Period Duration (Days): 7-14 Period Pattern: (!) Irregular Menstrual Flow: Light, Heavy (starts spotting and then goes to being very heavy) Menstrual Control: Maxi pad, Panty liner Dysmenorrhea: (!) Severe Dysmenorrhea Symptoms: Headache, Cramping  Patient with heavy painful periods.  Bleeds most of the month. Can have a week of spotting then a heavy period for 5 days after that with changing a saturated pad q 2hours.  Reports migraines on her cycle as well.  The pain was before her cycle and now is most of the month and is across her lower pelvis. Has dyspareunia as well She is frustrated with the bleeding and dysmenorrhea and anemia and would like a definitive management.  Has known endometriosis dx laparoscopy in 2015 Has tried ocp's in the past with no success BM are painful. No blood. No prior colonoscopy  GU: bothersome SUI, urgency, frequency, has to splint to void  Husband with vasectomy. Does not desire more children Pap smear 2020.  Denies abnormal Mother with breast cancer: had lumpectomy.   Body mass index is 29.94 kg/m.   Blood pressure 102/70, pulse (!) 101, height 5' 2.99 (1.6 m), weight 169 lb (76.7 kg), last menstrual period 07/04/2024, SpO2 99%.  No results found for: DIAGPAP, HPVHIGH, ADEQPAP  GYN HISTORY: No results found for: DIAGPAP, HPVHIGH, ADEQPAP  OB History  Gravida Para Term Preterm AB Living  6 4 2 2 2 4   SAB IAB Ectopic Multiple Live Births  2  0 0 4    # Outcome Date GA Lbr Len/2nd Weight Sex Type Anes PTL Lv  6 Preterm 06/15/19 [redacted]w[redacted]d / 00:18 6 lb 12.3 oz (3.07 kg) M Vag-Spont EPI  LIV  5 Preterm 03/05/16 [redacted]w[redacted]d 25:53 / 00:21 6 lb 15.1 oz (3.15 kg) M Vag-Spont EPI  LIV  4 Term 07/08/07    M Vag-Spont EPI  LIV  3 Term 12/09/04    M Vag-Spont EPI  LIV  2 SAB           1 SAB              Past Medical History:  Diagnosis Date   Arthritis    Cough 01/09/2017   Dysphagia    Endometriosis 04/18/2018   Exposure to the flu 01/09/2017   Fibroids 04/18/2018   Jerking movements of extremities 04/15/2015   Maternal age 16+, multigravida, antepartum 02/18/2016   Myalgia 04/15/2015   Myoclonic disorder 04/18/2018   Numbness and tingling of both legs 04/15/2015   Palpitations 01/29/2017   POTS (postural orthostatic tachycardia syndrome)    Shortness of breath dyspnea    Situational anxiety 09/28/2016   SVD (spontaneous vaginal delivery) 03/05/2016    Past Surgical History:  Procedure Laterality Date   LAPAROSCOPY ABDOMEN DIAGNOSTIC     for endometriosis    Current Outpatient Medications on File Prior to Visit  Medication Sig Dispense Refill   DULoxetine  (CYMBALTA ) 20 MG capsule Take 2 capsules (40 mg total) by mouth daily. 180 capsule 3   ibuprofen  (ADVIL ) 200 MG tablet Take 200 mg by mouth every 6 (six) hours as needed for fever, headache, mild pain, moderate pain or cramping.     melatonin 3 MG TABS tablet Take 3 mg by mouth at bedtime.     hydroxychloroquine (PLAQUENIL) 200 MG tablet Take 300 mg by mouth daily. (Patient not  taking: Reported on 07/18/2024)     No current facility-administered medications on file prior to visit.    Social History   Socioeconomic History   Marital status: Married    Spouse name: Not on file   Number of children: Not on file   Years of education: Not on file   Highest education level: Some college, no degree  Occupational History   Not on file  Tobacco Use   Smoking status: Never    Passive exposure: Never   Smokeless tobacco: Never  Vaping Use   Vaping status: Never Used  Substance and Sexual Activity   Alcohol use: No   Drug use: Never   Sexual activity: Not Currently    Partners: Male    Birth control/protection: None    Comment: 1st period 43yrs old, 1st intercourse 44ys old  Other Topics Concern   Not on file  Social  History Narrative   Lives with husband and 2 sons.  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   Social Drivers of Corporate investment banker Strain: Low Risk  (04/04/2024)   Overall Financial Resource Strain (CARDIA)    Difficulty of Paying Living Expenses: Not hard at all  Food Insecurity: No Food Insecurity (04/04/2024)   Hunger Vital Sign    Worried About Running Out of Food in the Last Year: Never true    Ran Out of Food in the Last Year: Never true  Transportation Needs: No Transportation Needs (04/04/2024)   PRAPARE - Administrator, Civil Service (Medical): No    Lack of Transportation (Non-Medical): No  Physical Activity: Insufficiently Active (04/04/2024)   Exercise Vital Sign    Days of Exercise per Week: 3 days    Minutes of Exercise per Session: 30 min  Stress: No Stress Concern Present (04/04/2024)   [employer] of Occupational Health - Occupational Stress Questionnaire    Feeling of Stress : Not at all  Social Connections: Moderately Integrated (04/04/2024)   Social Connection and Isolation Panel    Frequency of Communication with Friends and Family: More than three times a week    Frequency of Social Gatherings with Friends and Family: Once a week    Attends Religious Services: More than 4 times per year    Active Member of [employer] or Organizations: No    Attends Engineer, structural: Not on file    Marital Status: Married  Intimate Partner Violence: Unknown (03/14/2022)   Received from [facility]   HITS    Physically Hurt: Not on file    Insult or Talk Down To: Not on file    Threaten Physical Harm: Not on file    Scream or Curse: Not on file    Family History  Problem Relation Age of Onset   Diabetes Mother    Breast cancer Mother 72   Rheum arthritis Father    Epilepsy Sister    Heart Problems Sister    Cancer Maternal Grandmother    Diabetes Maternal Grandmother    Pancreatic cancer Maternal Grandmother    Liver disease Maternal  Grandfather    Rheum arthritis Paternal Grandmother    Heart Problems Paternal Grandfather    Stroke Paternal Grandfather    Diabetes Paternal Grandfather    Cancer Paternal Grandfather    Rheum arthritis Other      Allergies  Allergen Reactions   Lisdexamfetamine Palpitations   Prednisone Other (See Comments) and Palpitations    Reaction:  Joint  pain  Other Reaction(s): Arthralgias      [name] was [name] was 07/04/2024 (within days)..            Review of Systems Alls systems reviewed and are negative.     Physical Exam Constitutional:      Appearance: Normal appearance.  Genitourinary:     Vulva and urethral meatus normal.     No lesions in the vagina.     Right Labia: No rash, lesions or skin changes.    Left Labia: No lesions, skin changes or rash.    Vaginal discharge present.     No vaginal tenderness.     Anterior and posterior vaginal prolapse present.    No vaginal atrophy present.     Right Adnexa: not tender, not palpable and no mass present.    Left Adnexa: not tender, not palpable and no mass present.    No cervical motion tenderness or discharge.     Uterus is tender and irregular.     Uterus is not enlarged.  Breasts:    Right: Normal.     Left: Normal.  HENT:     Head: Normocephalic.  Neck:     Thyroid : No thyroid  mass, thyromegaly or thyroid  tenderness.  Cardiovascular:     Rate and Rhythm: Normal rate and regular rhythm.     Heart sounds: Normal heart sounds, S1 normal and S2 normal.  Pulmonary:     Effort: Pulmonary effort is normal.     Breath sounds: Normal breath sounds and air entry.  Abdominal:     General: There is no distension.     Palpations: Abdomen is soft. There is no mass.     Tenderness: There is no abdominal tenderness. There is no guarding or rebound.  Musculoskeletal:        General: Normal range of motion.     Cervical back: Full passive range of motion without pain,  normal range of motion and neck supple. No tenderness.     Right lower leg: No edema.     Left lower leg: No edema.  Neurological:     Mental Status: She is alert.  Skin:    General: Skin is warm.  Psychiatric:        Mood and Affect: Mood normal.        Behavior: Behavior normal.        Thought Content: Thought content normal.  Vitals and nursing note reviewed. Exam conducted with a chaperone present.    Date: 04/12/2018 Department: [facility] Released By: Dozier Elveria RAMAN Authorizing: Copland, Harlene BROCKS, MD   Exam Status  Status  Final [99]   PACS Intelerad Image Link   Show images for US  Transvaginal Non-OB Related Results          US  Pelvis Complete Final result 04/12/2018 10:10 AM      CLINICAL DATA:  Lower abdominal pain  EXAM: TRANSABDOMINAL AND TRANSVAGINAL ULTRASOUND OF PELVIS  TECHNIQUE: Both transabdominal and transvaginal ultrasound examinations of the pelvis were performed. Transabdominal technique was performed for global imaging of the pelvis including uterus, ovaries, adnexal regions, and pelvic cul-de-sac. It was necessary to proceed with ...   Study Result  Narrative & Impression  CLINICAL DATA:  Lower abdominal pain   EXAM: TRANSABDOMINAL AND TRANSVAGINAL ULTRASOUND OF PELVIS   TECHNIQUE: Both transabdominal and transvaginal ultrasound examinations of the pelvis were performed. Transabdominal technique was performed for global imaging of  the pelvis including uterus, ovaries, adnexal regions, and pelvic cul-de-sac. It was necessary to proceed with endovaginal exam following the transabdominal exam to visualize the uterus, endometrium, ovaries and adnexa.   COMPARISON:  08/17/2015   FINDINGS: Uterus   Measurements: 9.7 x 4.9 x 5.3 cm. Small complex cystic appearing area in the lower uterine segment in a submucosal location with diffuse internal echoes in septation. This measures 2.4 x 2.1 x 1.4 cm.    Endometrium   Thickness: 12 mm in thickness.  No focal abnormality visualized.   Right ovary   Measurements: 2.3 x 2.2 x 3.1 cm. Normal appearance/no adnexal mass.   Left ovary   Measurements: 3.2 x 1.9 x 2.4 cm. Normal appearance/no adnexal mass.   Other findings   No abnormal free fluid.   IMPRESSION: Small complex cystic-appearing area within the submucosal region of the lower uterine segment measuring up to 2.4 cm. This may reflect cystic degeneration of a small fibroid. This could be followed with repeat ultrasound in 6-12 months to ensure stability.     Electronically Signed   By: Franky Crease M.D.   On: 04/12/2018 10:14     A:         Well Woman GYN exam             Menorrhagia DUB Dysmenorrhea Known endometriosis History of laparoscopy 2015 with early endometriosis Painful bowel movements Fibroids Anemia SUI OAB                P:        Pap smear collected today Encouraged annual mammogram screening Colon cancer screening referral placed today DXA not indicated Labs and immunizations ordered today Discussed breast self exams Encouraged healthy lifestyle practices Encouraged Vit D and Calcium   Counseled  on all options for the bleeding an dysmenorrhea.  Patient would like to move with definitive treatment with the [facility]. Counseled extensively on the procedure including but not limited to what to expect and risks and benefits.  Counseled on postop care and pelvic rest for 10 weeks after the surgery with restricted lifting for 6 weeks after.  Counseled on the benefits of the robotic procedure with faster return to daily activities, improved outcomes, and less risk for complications. She would like to have this scheduled.  Consult placed with urogyn for possible combined case, if indicated for treatment of bladder complaints. RTC for PUS and EMB.  Counseled on the importance 20 minutes spent on reviewing records, imaging,  and one on one patient time and  counseling patient and documentation Dr. Glennon   No follow-ups on file.  Kathy Dorsey

## 2024-07-18 NOTE — Patient Instructions (Signed)
UROGYN REFERRAL: Dr. Jay Schlichter and Dr. Florian Buff at [facility] office: [address], [address] [phone]  The referral was placed but it may take a couple of weeks for the visit to be coordinated.

## 2024-07-19 ENCOUNTER — Encounter: Payer: Self-pay | Admitting: Obstetrics and Gynecology

## 2024-07-19 LAB — CBC
HCT: 39.6 % (ref 35.0–45.0)
Hemoglobin: 12.8 g/dL (ref 11.7–15.5)
MCH: 27.8 pg (ref 27.0–33.0)
MCHC: 32.3 g/dL (ref 32.0–36.0)
MCV: 85.9 fL (ref 80.0–100.0)
MPV: 9.6 fL (ref 7.5–12.5)
Platelets: 308 Thousand/uL (ref 140–400)
RBC: 4.61 Million/uL (ref 3.80–5.10)
RDW: 14 % (ref 11.0–15.0)
WBC: 6.2 Thousand/uL (ref 3.8–10.8)

## 2024-07-19 LAB — COMPREHENSIVE METABOLIC PANEL WITH GFR
AG Ratio: 1.6 (calc) (ref 1.0–2.5)
ALT: 13 U/L (ref 6–29)
AST: 14 U/L (ref 10–30)
Albumin: 4.5 g/dL (ref 3.6–5.1)
Alkaline phosphatase (APISO): 55 U/L (ref 31–125)
BUN: 12 mg/dL (ref 7–25)
CO2: 26 mmol/L (ref 20–32)
Calcium: 9.4 mg/dL (ref 8.6–10.2)
Chloride: 101 mmol/L (ref 98–110)
Creat: 0.81 mg/dL (ref 0.50–0.99)
Globulin: 2.9 g/dL (ref 1.9–3.7)
Glucose, Bld: 95 mg/dL (ref 65–99)
Potassium: 4.1 mmol/L (ref 3.5–5.3)
Sodium: 136 mmol/L (ref 135–146)
Total Bilirubin: 0.4 mg/dL (ref 0.2–1.2)
Total Protein: 7.4 g/dL (ref 6.1–8.1)
eGFR: 92 mL/min/1.73m2 (ref >=60)

## 2024-07-19 LAB — IRON,TIBC AND FERRITIN PANEL
%SAT: 18 % (ref 16–45)
Ferritin: 7 ng/mL — ABNORMAL LOW (ref 16–232)
Iron: 67 ug/dL (ref 40–190)
TIBC: 367 ug/dL (ref 250–450)

## 2024-07-19 LAB — LIPID PANEL
Cholesterol: 210 mg/dL — ABNORMAL HIGH (ref <200)
HDL: 82 mg/dL (ref >=50)
LDL Cholesterol (Calc): 111 mg/dL — ABNORMAL HIGH
Non-HDL Cholesterol (Calc): 128 mg/dL (ref <130)
Total CHOL/HDL Ratio: 2.6 (calc) (ref <5.0)
Triglycerides: 83 mg/dL (ref <150)

## 2024-07-19 LAB — ESTRADIOL: Estradiol: 103 pg/mL

## 2024-07-19 LAB — FOLLICLE STIMULATING HORMONE: FSH: 12.6 m[IU]/mL

## 2024-07-19 LAB — HEMOGLOBIN A1C
Hgb A1c MFr Bld: 5.9 % — ABNORMAL HIGH (ref <5.7)
Mean Plasma Glucose: 123 mg/dL
eAG (mmol/L): 6.8 mmol/L

## 2024-07-19 LAB — TSH: TSH: 2.28 m[IU]/L

## 2024-07-19 LAB — VITAMIN D 25 HYDROXY (VIT D DEFICIENCY, FRACTURES): Vit D, 25-Hydroxy: 26 ng/mL — ABNORMAL LOW (ref 30–100)

## 2024-07-20 LAB — URINE CULTURE
MICRO NUMBER:: 16806135
SPECIMEN QUALITY:: ADEQUATE

## 2024-07-20 LAB — URINALYSIS, COMPLETE W/RFL CULTURE
Bacteria, UA: NONE SEEN /HPF
Bilirubin Urine: NEGATIVE
Casts: NONE SEEN /LPF
Crystals: NONE SEEN /HPF
Glucose, UA: NEGATIVE
Ketones, ur: NEGATIVE
Leukocyte Esterase: NEGATIVE
Nitrites, Initial: NEGATIVE
Protein, ur: NEGATIVE
Specific Gravity, Urine: 1.025 (ref 1.001–1.035)
WBC, UA: NONE SEEN /HPF (ref 0–5)
Yeast: NONE SEEN /HPF
pH: 6 (ref 5.0–8.0)

## 2024-07-20 LAB — CULTURE INDICATED

## 2024-07-21 LAB — SURESWAB® ADVANCED VAGINITIS PLUS,TMA
C. trachomatis RNA, TMA: NOT DETECTED
CANDIDA SPECIES: NOT DETECTED
Candida glabrata: NOT DETECTED
N. gonorrhoeae RNA, TMA: NOT DETECTED
SURESWAB(R) ADV BACTERIAL VAGINOSIS(BV),TMA: POSITIVE — AB
TRICHOMONAS VAGINALIS (TV),TMA: NOT DETECTED

## 2024-07-23 LAB — CYTOLOGY - PAP
Comment: NEGATIVE
Diagnosis: NEGATIVE
High risk HPV: NEGATIVE

## 2024-07-25 ENCOUNTER — Encounter: Payer: Self-pay | Admitting: Obstetrics and Gynecology

## 2024-07-31 ENCOUNTER — Telehealth: Payer: Self-pay | Admitting: *Deleted

## 2024-07-31 NOTE — Telephone Encounter (Signed)
 Tried to contact pt to let her know that Dr. Chandra is going to review the labs that she has had on 8/8 and that I would call her to let her know if she needs to come in for more labs or not.

## 2024-08-01 ENCOUNTER — Other Ambulatory Visit

## 2024-08-05 ENCOUNTER — Encounter: Payer: Self-pay | Admitting: Obstetrics and Gynecology

## 2024-08-13 ENCOUNTER — Emergency Department (HOSPITAL_BASED_OUTPATIENT_CLINIC_OR_DEPARTMENT_OTHER)

## 2024-08-13 ENCOUNTER — Encounter (HOSPITAL_BASED_OUTPATIENT_CLINIC_OR_DEPARTMENT_OTHER): Payer: Self-pay

## 2024-08-13 ENCOUNTER — Emergency Department (HOSPITAL_BASED_OUTPATIENT_CLINIC_OR_DEPARTMENT_OTHER)
Admission: EM | Admit: 2024-08-13 | Discharge: 2024-08-13 | Disposition: A | Discharge status: Home / Self Care | Attending: Emergency Medicine | Admitting: Emergency Medicine

## 2024-08-13 ENCOUNTER — Other Ambulatory Visit: Payer: Self-pay

## 2024-08-13 DIAGNOSIS — M25512 Pain in left shoulder: Secondary | ICD-10-CM | POA: Insufficient documentation

## 2024-08-13 DIAGNOSIS — R519 Headache, unspecified: Secondary | ICD-10-CM | POA: Diagnosis not present

## 2024-08-13 DIAGNOSIS — M542 Cervicalgia: Secondary | ICD-10-CM | POA: Insufficient documentation

## 2024-08-13 DIAGNOSIS — M79602 Pain in left arm: Secondary | ICD-10-CM | POA: Diagnosis not present

## 2024-08-13 LAB — CBC WITH DIFFERENTIAL/PLATELET
Abs Immature Granulocytes: 0.02 K/uL (ref 0.00–0.07)
Basophils Absolute: 0 K/uL (ref 0.0–0.1)
Basophils Relative: 1 %
Eosinophils Absolute: 0 K/uL (ref 0.0–0.5)
Eosinophils Relative: 1 %
HCT: 38.9 % (ref 36.0–46.0)
Hemoglobin: 13.1 g/dL (ref 12.0–15.0)
Immature Granulocytes: 0 %
Lymphocytes Relative: 25 %
Lymphs Abs: 1.5 K/uL (ref 0.7–4.0)
MCH: 28.4 pg (ref 26.0–34.0)
MCHC: 33.7 g/dL (ref 30.0–36.0)
MCV: 84.2 fL (ref 80.0–100.0)
Monocytes Absolute: 0.6 K/uL (ref 0.1–1.0)
Monocytes Relative: 9 %
Neutro Abs: 3.9 K/uL (ref 1.7–7.7)
Neutrophils Relative %: 64 %
Platelets: 293 K/uL (ref 150–400)
RBC: 4.62 MIL/uL (ref 3.87–5.11)
RDW: 14.2 % (ref 11.5–15.5)
WBC: 6 K/uL (ref 4.0–10.5)
nRBC: 0 % (ref 0.0–0.2)

## 2024-08-13 LAB — BASIC METABOLIC PANEL WITH GFR
Anion gap: 11 (ref 5–15)
BUN: 12 mg/dL (ref 6–20)
CO2: 25 mmol/L (ref 22–32)
Calcium: 9.1 mg/dL (ref 8.9–10.3)
Chloride: 101 mmol/L (ref 98–111)
Creatinine, Ser: 0.91 mg/dL (ref 0.44–1.00)
GFR, Estimated: >60 mL/min (ref >60)
Glucose, Bld: 88 mg/dL (ref 70–99)
Potassium: 4.1 mmol/L (ref 3.5–5.1)
Sodium: 137 mmol/L (ref 135–145)

## 2024-08-13 LAB — TROPONIN T, HIGH SENSITIVITY: Troponin T High Sensitivity: <15 ng/L (ref 0–19)

## 2024-08-13 LAB — HCG, SERUM, QUALITATIVE: Preg, Serum: NEGATIVE

## 2024-08-13 MED ORDER — HYDROCODONE-ACETAMINOPHEN 5-325 MG PO TABS: 1.0000 | ORAL_TABLET | Freq: Four times a day (QID) | ORAL | 0 refills | Status: AC | PRN

## 2024-08-13 MED ORDER — KETOROLAC TROMETHAMINE 15 MG/ML IJ SOLN
15.0000 mg | Freq: Once | INTRAMUSCULAR | Status: AC
  Administered 2024-08-13: 15 mg via INTRAVENOUS
  Filled 2024-08-13: qty 1

## 2024-08-13 MED ORDER — METHOCARBAMOL 500 MG PO TABS: 500.0000 mg | ORAL_TABLET | Freq: Two times a day (BID) | ORAL | 0 refills | Status: AC

## 2024-08-13 MED ORDER — ACETAMINOPHEN 500 MG PO TABS
1000.0000 mg | ORAL_TABLET | Freq: Once | ORAL | Status: AC
  Administered 2024-08-13: 1000 mg via ORAL
  Filled 2024-08-13: qty 2

## 2024-08-13 MED ORDER — MORPHINE SULFATE (PF) 4 MG/ML IV SOLN
4.0000 mg | Freq: Once | INTRAVENOUS | Status: DC
  Filled 2024-08-13: qty 1

## 2024-08-13 MED ORDER — IOHEXOL 350 MG/ML SOLN
50.0000 mL | Freq: Once | INTRAVENOUS | Status: AC | PRN
  Administered 2024-08-13: 50 mL via INTRAVENOUS

## 2024-08-13 NOTE — ED Provider Notes (Signed)
 [place] [facility] Provider Note   CSN: [id number] Arrival date & time: 08/13/24  1147     Patient presents with: Arm Pain and Headache   Kathy Dorsey is a 43 y.o. female with a past medical history significant for POTS, endometriosis, and myoclonic disorder who presents to the ED due to left-sided neck and shoulder pain x 3 weeks.  Patient states she went to the chiropractor 3 weeks ago due to left sided pain and had her neck adjusted and has been having severe pain ever since.  Pain started in her left shoulder blade and now radiates to left side of her neck down her left upper extremity.  Admits to numbness/tingling to left upper extremity.  Admits to dropping a few items from her left hand.  Denies any significant left-sided weakness and believes she dropped the items due to tingling sensation.  No lower extremity symptoms.  Denies saddle anesthesia, bowel/bladder incontinence, lower extremity numbness/tingling, lower extremity weakness.  No fever or chills.  No injury to neck or shoulder.  Denies IV drug use.  Admits to a posterior headache associated with nausea.  No visual or speech changes.  History obtained from patient and past medical records. No interpreter used during encounter.       Prior to Admission medications   Medication Sig Start Date End Date Taking? Authorizing Provider  HYDROcodone -acetaminophen  (NORCO/VICODIN) 5-325 MG tablet Take 1 tablet by mouth every 6 (six) hours as needed. 08/13/24  Yes Laree Garron, Aleck BROCKS, PA-C  methocarbamol  (ROBAXIN ) 500 MG tablet Take 1 tablet (500 mg total) by mouth 2 (two) times daily. 08/13/24  Yes Myquan Schaumburg, Aleck BROCKS, PA-C  DULoxetine  (CYMBALTA ) 20 MG capsule Take 2 capsules (40 mg total) by mouth daily. 04/04/24   Chandra Toribio POUR, MD  hydroxychloroquine (PLAQUENIL) 200 MG tablet Take 300 mg by mouth daily. Patient not taking: Reported on 07/18/2024 12/08/16   [provider]  ibuprofen  (ADVIL ) 200  MG tablet Take 200 mg by mouth every 6 (six) hours as needed for fever, headache, mild pain, moderate pain or cramping. 01/27/21   [provider]  melatonin 3 MG TABS tablet Take 3 mg by mouth at bedtime.    [provider]    Allergies: Lisdexamfetamine and Prednisone    Review of Systems  Eyes:  Negative for visual disturbance.  Musculoskeletal:  Positive for neck pain.  Neurological:  Positive for numbness and headaches. Negative for speech difficulty.    Updated Vital Signs BP 105/72 (BP Location: Left Arm)   Pulse 90   Temp 97.9 F (36.6 C) (Oral)   Resp 16   Wt 72.6 kg   LMP 08/03/2024 (Within Days)   SpO2 100%   BMI 28.35 kg/m   Physical Exam Vitals and nursing note reviewed.  Constitutional:      General: She is not in acute distress.    Appearance: She is not ill-appearing.  HENT:     Head: Normocephalic.  Eyes:     Pupils: Pupils are equal, round, and reactive to light.  Neck:     Comments: No cervical midline tenderness. Some producible left-sided neck tenderness. Cardiovascular:     Rate and Rhythm: Normal rate and regular rhythm.     Pulses: Normal pulses.     Heart sounds: Normal heart sounds. No murmur heard.    No friction rub. No gallop.  Pulmonary:     Effort: Pulmonary effort is normal.     Breath sounds: Normal  breath sounds.  Abdominal:     General: Abdomen is flat. There is no distension.     Palpations: Abdomen is soft.     Tenderness: There is no abdominal tenderness. There is no guarding or rebound.  Musculoskeletal:        General: Normal range of motion.     Cervical back: Neck supple.  Skin:    General: Skin is warm and dry.  Neurological:     General: No focal deficit present.     Mental Status: She is alert.     Comments: Equal grip strength, no pronator drift  Psychiatric:        Mood and Affect: Mood normal.        Behavior: Behavior normal.     (all labs ordered are listed, but only abnormal results are  displayed) Labs Reviewed  CBC WITH DIFFERENTIAL/PLATELET  BASIC METABOLIC PANEL WITH GFR  HCG, SERUM, QUALITATIVE  TROPONIN T, HIGH SENSITIVITY    EKG: EKG Interpretation Date/Time:  Wednesday August 13 2024 11:57:34 EDT Ventricular Rate:  99 PR Interval:  115 QRS Duration:  101 QT Interval:  347 QTC Calculation: 446 R Axis:   55  Text Interpretation: Sinus rhythm Low voltage, precordial leads RSR' in V1 or V2, right VCD or RVH Confirmed by Yolande Charleston [phone]) on 08/13/2024 12:39:44 PM  Radiology: CT ANGIO HEAD NECK W WO CM Result Date: 08/13/2024 EXAM: CTA HEAD AND NECK WITH AND WITHOUT 08/13/2024 01:49:38 PM TECHNIQUE: CTA of the head and neck was performed with and without the administration of intravenous contrast. Multiplanar 2D and/or 3D reformatted images are provided for review. Automated exposure control, iterative reconstruction, and/or weight based adjustment of the mA/kV was utilized to reduce the radiation dose to as low as reasonably achievable. Stenosis of the internal carotid arteries measured using NASCET criteria. COMPARISON: None available CLINICAL HISTORY: Carotid artery dissection suspected. FINDINGS: CTA NECK: AORTIC ARCH AND ARCH VESSELS: Common origin of the brachiocephalic and left common carotid arteries. No dissection or arterial injury. No significant stenosis of the brachiocephalic or subclavian arteries. CERVICAL CAROTID ARTERIES: No dissection, arterial injury, or hemodynamically significant stenosis by NASCET criteria. CERVICAL VERTEBRAL ARTERIES: No dissection, arterial injury, or significant stenosis. LUNGS AND MEDIASTINUM: Unremarkable. SOFT TISSUES: No acute abnormality. BONES: Dissertive changes in the cervical spine with mild disc space narrowing and disc osteophyte complex at C5-6. CTA HEAD: ANTERIOR CIRCULATION: The intracranial internal carotid arteries are patent bilaterally. There is a 2 mm outpouching along the right supraclinoid ICA. Additional  similar 1.5 mm outpouching along the left supraclinoid ICA which may reflect an infundibulum versus small aneurysm. POSTERIOR CIRCULATION: No significant stenosis of the posterior cerebral arteries. No significant stenosis of the basilar artery. No significant stenosis of the vertebral arteries. No aneurysm. OTHER: No dural venous sinus thrombosis on this non-dedicated study. IMPRESSION: 1. No evidence of carotid artery dissection. 2. No large vessel occlusion. 3. 2 mm outpouching along the right supraclinoid ICA and 1.5 mm outpouching along the left supraclinoid ICA, possibly representing infundibula versus small aneurysms. Electronically signed by: Donnice Mania MD 08/13/2024 02:53 PM EDT RP Workstation: [id number]   DG Chest 1 View Result Date: 08/13/2024 CLINICAL DATA:  Chest pain. EXAM: CHEST  1 VIEW COMPARISON:  Chest radiograph dated 04/11/2018. FINDINGS: The heart size and mediastinal contours are within normal limits. Both lungs are clear. The visualized skeletal structures are unremarkable. IMPRESSION: No active disease. Electronically Signed   By: Vanetta Chou M.D.   On: 08/13/2024 13:54  DG Shoulder Left Result Date: 08/13/2024 CLINICAL DATA:  Left scapular pain radiating into the left shoulder and neck EXAM: LEFT SHOULDER - 2+ VIEW COMPARISON:  None Available. FINDINGS: ECG leads project over the left shoulder. Glenohumeral alignment appears normal. Subacromial morphology is type 2 (curved). AC joint unremarkable. No fracture or acute bony findings identified. IMPRESSION: 1. No acute findings. If pain persists despite conservative therapy, MRI may be warranted for further characterization. Electronically Signed   By: Ryan Salvage M.D.   On: 08/13/2024 13:53     Procedures   Medications Ordered in the ED  acetaminophen  (TYLENOL ) tablet 1,000 mg (1,000 mg Oral Given 08/13/24 1321)  iohexol  (OMNIPAQUE ) 350 MG/ML injection 50 mL (50 mLs Intravenous Contrast Given 08/13/24 1341)  ketorolac   (TORADOL ) 15 MG/ML injection 15 mg (15 mg Intravenous Given 08/13/24 1508)    Clinical Course as of 08/13/24 1535  Wed Aug 13, 2024  1301 Patient declined morphine . Will try Tylenol  until CTA neck results then can add toradol  if needed [CA]    Clinical Course User Index [CA] Lorelle Aleck BROCKS, PA-C                                 Medical Decision Making Amount and/or Complexity of Data Reviewed Labs: ordered. Decision-making details documented in ED Course. Radiology: ordered and independent interpretation performed. Decision-making details documented in ED Course. ECG/medicine tests: ordered and independent interpretation performed. Decision-making details documented in ED Course.  Risk OTC drugs. Prescription drug management.   This patient presents to the ED for concern of left neck pain, this involves an extensive number of treatment options, and is a complaint that carries with it a high risk of complications and morbidity.  The differential diagnosis includes carotid artery dissection, nerve impingement, muscular strain, ACS, etc  43 year old female presents to the ED due to left-sided neck, shoulder, and upper extremity pain x 3 weeks.  Went to the chiropractor 3 weeks ago and had her neck adjusted.  Now admits to numbness/tingling to left upper extremity.  Also admits to dropping a few items from left hand.  No visual or speech changes.  Upon arrival patient afebrile, not tachycardic or hypoxic.  Patient in no acute distress.  Does have some reproducible tenderness to left side of neck.  Equal grip strength.  No pronator drift.  Routine labs ordered.  CTA head/neck to rule out dissection.  Low suspicion for central cord compression.  Patient moving all 4 extremities without difficulty.  Equal grip strength.  CBC unremarkable.  No leukocytosis.  Normal hemoglobin.  BMP unremarkable.  Normal renal function.  No major electrolyte derangements.  Pregnancy test negative.  Troponin  normal. EKG NSR.  Low suspicion for ACS.  Chest x-ray personally reviewed and interpreted which is negative for signs of pneumonia, pneumothorax, widened mediastinum.  Presentation nonconcerning for PE or aortic dissection.  Shoulder x-ray negative.  CTA head and neck negative for dissection.  No LVO.  Does demonstrate some outpouching along right supraclinoid ICA.   Upon reassessment, patient has improvement in headache. Has no appreciated weakness on exam. Low suspicion for CVA or central cord compression. Possible nerve impingement? Will refer patient to orthopedics for further evaluation. Patient discharged with pain medication. Patient stable for discharge. Strict ED precautions discussed with patient. Patient states understanding and agrees to plan. Patient discharged home in no acute distress and stable vitals  Co morbidities that complicate the patient  evaluation  POTS Cardiac Monitoring: / EKG:  The patient was maintained on a cardiac monitor.  I personally viewed and interpreted the cardiac monitored which showed an underlying rhythm of: NSR  Social Determinants of Health:  Has PCP     Final diagnoses:  Neck pain  Left arm pain    ED Discharge Orders          Ordered    HYDROcodone -acetaminophen  (NORCO/VICODIN) 5-325 MG tablet  Every 6 hours PRN        08/13/24 1534    methocarbamol  (ROBAXIN ) 500 MG tablet  2 times daily        08/13/24 1534               Lorelle Aleck JAYSON DEVONNA 08/13/24 1535    Yolande Lamar JAYSON, MD 08/13/24 1616

## 2024-08-13 NOTE — ED Notes (Addendum)
 Patient transported to XR and back without event.

## 2024-08-13 NOTE — ED Triage Notes (Signed)
 Reports worsening pain/tingling in L side of neck, shoulder and L arm for 3 weeks. Headache, nausea since yesterday.   Denies chest pain, SHOB, emesis

## 2024-08-13 NOTE — Discharge Instructions (Signed)
 It was a pleasure taking care of you today.  As discussed, your workup is reassuring.  I have included the number of the orthopedic surgeon.  Please call to schedule an appointment for further evaluation.  I am sending you home with pain medication and a muscle relaxer.  Medications can cause drowsiness so do not drive or operate machinery while on the medication.  Return to the ER for any worsening symptoms.

## 2024-08-21 ENCOUNTER — Other Ambulatory Visit: Admitting: Obstetrics and Gynecology

## 2024-08-21 ENCOUNTER — Other Ambulatory Visit

## 2024-11-03 NOTE — Progress Notes (Deleted)
 New Patient Evaluation and Consultation  Referring Provider: Glennon Almarie POUR, MD PCP: Chandra Toribio POUR, MD Date of Service: 11/05/2024  SUBJECTIVE Chief Complaint: No chief complaint on file.  History of Present Illness: Kathy Dorsey is a 43 y.o. White or Caucasian female seen in consultation at the request of Dr Glennon for evaluation of mixed urinary incontinence and pelvic organ prolapse.    Underwent diagnostic laparoscopy in 2015 for endometriosis, reports painful bowel movements Tried OCPs in the past Splints to void Reviewed treatment options of AUB with Dr. Glennon, ordered TVUS ***  ***Review of records significant for: ***POTS, myoclonic disorder, endometriosis, fibroids,   Urinary Symptoms: {urine leakage?:24754} Leaks *** time(s) per {days/wks/mos/yrs:310907}.  Pad use: {NUMBERS 1-10:18281} {pad option:24752} per day.   Patient {ACTION; IS/IS WNU:78978602} bothered by UI symptoms.  Day time voids ***.  Nocturia: *** times per night to void. Voiding dysfunction:  {empties:24755} bladder well.  Patient {DOES NOT does:27190::does not} use a catheter to empty bladder.  When urinating, patient feels {urine symptoms:24756} Drinks: *** per day  UTIs: {NUMBERS 1-10:18281} UTI's in the last year.   {ACTIONS;DENIES/REPORTS:21021675::Denies} history of {urologic concerns:24757} No results found for the last 90 days.   Pelvic Organ Prolapse Symptoms:                  Patient {denies/ admits to:24761} a feeling of a bulge the vaginal area. It has been present for {NUMBER 1-10:22536} {days/wks/mos/yrs:310907}.  Patient {denies/ admits to:24761} seeing a bulge.  This bulge {ACTION; IS/IS WNU:78978602} bothersome.  Bowel Symptom: Bowel movements: *** time(s) per {Time; day/week/month:13537} Stool consistency: {stool consistency:24758} Straining: {yes/no:19897}.  Splinting: {yes/no:19897}.  Incomplete evacuation: {yes/no:19897}.  Patient {denies/ admits to:24761}  accidental bowel leakage / fecal incontinence  Occurs: *** time(s) per {Time; day/week/month:13537}  Consistency with leakage: {stool consistency:24758} Bowel regimen: {bowel regimen:24759} Last colonoscopy: Date ***, Results *** [facility] Colonoscopy   This patient has no relevant Health Maintenance data.     Sexual Function Sexually active: {yes/no:19897}.  Sexual orientation: {Sexual Orientation:[phone]} Pain with sex: {pain with sex:24762}  Pelvic Pain {denies/ admits to:24761} pelvic pain Location: *** Pain occurs: *** Prior pain treatment: *** Improved by: *** Worsened by: ***   Past Medical History:  Past Medical History:  Diagnosis Date   Arthritis    Cough 01/09/2017   Dysphagia    Endometriosis 04/18/2018   Exposure to the flu 01/09/2017   Fibroid    submucosal   Fibroids 04/18/2018   Jerking movements of extremities 04/15/2015   Maternal age 67+, multigravida, antepartum 02/18/2016   Myalgia 04/15/2015   Myoclonic disorder 04/18/2018   Numbness and tingling of both legs 04/15/2015   Palpitations 01/29/2017   POTS (postural orthostatic tachycardia syndrome)    Shortness of breath dyspnea    Situational anxiety 09/28/2016   SUI (stress urinary incontinence, female)    SVD (spontaneous vaginal delivery) 03/05/2016     Past Surgical History:   Past Surgical History:  Procedure Laterality Date   LAPAROSCOPY ABDOMEN DIAGNOSTIC     for endometriosis     Past OB/GYN History: OB History  Gravida Para Term Preterm AB Living  6 4 2 2 2 4   SAB IAB Ectopic Multiple Live Births  2  0 0 4    # Outcome Date GA Lbr Len/2nd Weight Sex Type Anes PTL Lv  6 Preterm 06/15/19 [redacted]w[redacted]d / 00:18 6 lb 12.3 oz (3.07 kg) M Vag-Spont EPI  LIV  5 Preterm 03/05/16 [redacted]w[redacted]d 25:53 / 00:21 6 lb 15.1  oz (3.15 kg) M Vag-Spont EPI  LIV  4 Term 07/08/07    M Vag-Spont EPI  LIV  3 Term 12/09/04    M Vag-Spont EPI  LIV  2 SAB           1 SAB             Vaginal deliveries: ***,   Forceps/ Vacuum deliveries: 0, Cesarean section: 0 Menopausal: No, LMP No LMP recorded. Contraception: vasectomy. Last pap smear.  Any history of abnormal pap smears: {yes/no:19897}.    Component Value Date/Time   DIAGPAP  07/18/2024 1148    - Negative for intraepithelial lesion or malignancy (NILM)   HPVHIGH Negative 07/18/2024 1148   ADEQPAP  07/18/2024 1148    Satisfactory for evaluation; transformation zone component PRESENT.    Medications: Patient has a current medication list which includes the following prescription(s): duloxetine , hydrocodone -acetaminophen , hydroxychloroquine, ibuprofen , melatonin, and methocarbamol .   Allergies: Patient is allergic to lisdexamfetamine and prednisone.   Social History:  Social History   Tobacco Use   Smoking status: Never    Passive exposure: Never   Smokeless tobacco: Never  Vaping Use   Vaping status: Never Used  Substance Use Topics   Alcohol use: No   Drug use: Never    Relationship status: {relationship status:24764} Patient lives with ***.   Patient {ACTION; IS/IS WNU:78978602} employed ***. Regular exercise: {Yes/No:304960894} History of abuse: {Yes/No:304960894}  Family History:   Family History  Problem Relation Age of Onset   Diabetes Mother    Breast cancer Mother 2   Rheum arthritis Father    Epilepsy Sister    Heart Problems Sister    Cancer Maternal Grandmother    Diabetes Maternal Grandmother    Pancreatic cancer Maternal Grandmother    Liver disease Maternal Grandfather    Rheum arthritis Paternal Grandmother    Heart Problems Paternal Grandfather    Stroke Paternal Grandfather    Diabetes Paternal Grandfather    Cancer Paternal Grandfather    Rheum arthritis Other      Review of Systems: ROS   OBJECTIVE Physical Exam: There were no vitals filed for this visit.  Physical Exam   GU / Detailed Urogynecologic Evaluation:  Pelvic Exam: Normal external female genitalia; Bartholin's and Skene's  glands normal in appearance; urethral meatus normal in appearance, no urethral masses or discharge.   CST: {gen negative/positive:315881}  Reflexes: bulbocavernosis {DESC; PRESENT/NOT PRESENT:21021351}, anocutaneous {DESC; PRESENT/NOT PRESENT:21021351} ***bilaterally.  Speculum exam reveals normal vaginal mucosa {With/Without:20273} atrophy. Cervix {exam; gyn cervix:30847}. Uterus {exam; pelvic uterus:30849}. Adnexa {exam; adnexa:12223}.    s/p hysterectomy: Speculum exam reveals normal vaginal mucosa {With/Without:20273}  atrophy and normal vaginal cuff.  Adnexa {exam; adnexa:12223}.    With apex supported, anterior compartment defect was {reduced:24765}  Pelvic floor strength {Roman # I-V:19040}/V, puborectalis {Roman # I-V:19040}/V external anal sphincter {Roman # I-V:19040}/V  Pelvic floor musculature: Right levator {Tender/Non-tender:20250}, Right obturator {Tender/Non-tender:20250}, Left levator {Tender/Non-tender:20250}, Left obturator {Tender/Non-tender:20250}  POP-Q:   POP-Q                                               Aa  Ba                                                 C                                                Gh                                               Pb                                               tvl                                                Ap                                               Bp                                                 D      Rectal Exam:  Normal sphincter tone, {rectocele:24766} distal rectocele, enterocoele {DESC; PRESENT/NOT PRESENT:21021351}, no rectal masses, {sign of:24767} dyssynergia when asking the patient to bear down.  Post-Void Residual (PVR) by Bladder Scan: In order to evaluate bladder emptying, we discussed obtaining a postvoid residual and patient agreed to this procedure.  Procedure: The ultrasound unit was placed on the patient's abdomen in the  suprapubic region after the patient had voided.      Laboratory Results: Lab Results  Component Value Date   COLORU yellow 04/11/2018   CLARITYU clear 04/11/2018   GLUCOSEUR negative 04/11/2018   BILIRUBINUR negative 04/11/2018   KETONESU Negative 01/01/2017   SPECGRAV 1.025 04/11/2018   RBCUR negative 04/11/2018   PHUR 6.0 04/11/2018   PROTEINUR NEGATIVE 07/18/2024   UROBILINOGEN 0.2 04/11/2018   LEUKOCYTESUR Negative 04/11/2018    Lab Results  Component Value Date   CREATININE 0.91 08/13/2024   CREATININE 0.81 07/18/2024   CREATININE 0.88 12/07/2022    Lab Results  Component Value Date   HGBA1C 5.9 (H) 07/18/2024    Lab Results  Component Value Date   HGB 13.1 08/13/2024     ASSESSMENT AND PLAN Ms. Kreh is a 43 y.o. with: No diagnosis found.  There are no diagnoses linked to this encounter.   Lianne ONEIDA Gillis, MD

## 2024-11-04 ENCOUNTER — Other Ambulatory Visit: Payer: Self-pay | Admitting: Family Medicine

## 2024-11-04 DIAGNOSIS — G629 Polyneuropathy, unspecified: Secondary | ICD-10-CM

## 2024-11-04 DIAGNOSIS — M064 Inflammatory polyarthropathy: Secondary | ICD-10-CM

## 2024-11-05 ENCOUNTER — Ambulatory Visit: Admitting: Obstetrics

## 2024-11-17 ENCOUNTER — Encounter: Payer: Self-pay | Admitting: *Deleted

## 2024-12-21 ENCOUNTER — Emergency Department (HOSPITAL_BASED_OUTPATIENT_CLINIC_OR_DEPARTMENT_OTHER)

## 2024-12-21 ENCOUNTER — Other Ambulatory Visit: Payer: Self-pay

## 2024-12-21 ENCOUNTER — Emergency Department (HOSPITAL_BASED_OUTPATIENT_CLINIC_OR_DEPARTMENT_OTHER): Admission: EM | Admit: 2024-12-21 | Discharge: 2024-12-21 | Disposition: A | Discharge status: Home / Self Care

## 2024-12-21 ENCOUNTER — Encounter (HOSPITAL_BASED_OUTPATIENT_CLINIC_OR_DEPARTMENT_OTHER): Payer: Self-pay

## 2024-12-21 DIAGNOSIS — R1012 Left upper quadrant pain: Secondary | ICD-10-CM

## 2024-12-21 DIAGNOSIS — R079 Chest pain, unspecified: Secondary | ICD-10-CM | POA: Insufficient documentation

## 2024-12-21 DIAGNOSIS — K4020 Bilateral inguinal hernia, without obstruction or gangrene, not specified as recurrent: Secondary | ICD-10-CM | POA: Diagnosis not present

## 2024-12-21 LAB — URINALYSIS, MICROSCOPIC (REFLEX)

## 2024-12-21 LAB — COMPREHENSIVE METABOLIC PANEL WITH GFR
ALT: 9 U/L (ref 0–44)
AST: 19 U/L (ref 15–41)
Albumin: 4.6 g/dL (ref 3.5–5.0)
Alkaline Phosphatase: 61 U/L (ref 38–126)
Anion gap: 12 (ref 5–15)
BUN: 12 mg/dL (ref 6–20)
CO2: 26 mmol/L (ref 22–32)
Calcium: 9.3 mg/dL (ref 8.9–10.3)
Chloride: 101 mmol/L (ref 98–111)
Creatinine, Ser: 0.85 mg/dL (ref 0.44–1.00)
GFR, Estimated: >60 mL/min
Glucose, Bld: 107 mg/dL — ABNORMAL HIGH (ref 70–99)
Potassium: 4 mmol/L (ref 3.5–5.1)
Sodium: 138 mmol/L (ref 135–145)
Total Bilirubin: 0.4 mg/dL (ref 0.0–1.2)
Total Protein: 7.4 g/dL (ref 6.5–8.1)

## 2024-12-21 LAB — CBC
HCT: 38.4 % (ref 36.0–46.0)
Hemoglobin: 12.7 g/dL (ref 12.0–15.0)
MCH: 27.9 pg (ref 26.0–34.0)
MCHC: 33.1 g/dL (ref 30.0–36.0)
MCV: 84.2 fL (ref 80.0–100.0)
Platelets: 329 K/uL (ref 150–400)
RBC: 4.56 MIL/uL (ref 3.87–5.11)
RDW: 13.7 % (ref 11.5–15.5)
WBC: 5.6 K/uL (ref 4.0–10.5)
nRBC: 0 % (ref 0.0–0.2)

## 2024-12-21 LAB — URINALYSIS, ROUTINE W REFLEX MICROSCOPIC
Bilirubin Urine: NEGATIVE
Glucose, UA: NEGATIVE mg/dL
Ketones, ur: NEGATIVE mg/dL
Nitrite: NEGATIVE
Protein, ur: NEGATIVE mg/dL
Specific Gravity, Urine: >=1.03 (ref 1.005–1.030)
pH: 5.5 (ref 5.0–8.0)

## 2024-12-21 LAB — PREGNANCY, URINE: Preg Test, Ur: NEGATIVE

## 2024-12-21 LAB — LIPASE, BLOOD: Lipase: 24 U/L (ref 11–51)

## 2024-12-21 MED ORDER — IOHEXOL 300 MG/ML  SOLN
100.0000 mL | Freq: Once | INTRAMUSCULAR | Status: AC | PRN
  Administered 2024-12-21: 80 mL via INTRAVENOUS

## 2024-12-21 NOTE — ED Provider Notes (Signed)
 " [place] [facility] Provider Note   CSN: [id number] Arrival date & time: 12/21/24  1235     Patient presents with: Abdominal Pain   Kathy Dorsey is a 44 y.o. female.   Patient to the emergency department today for evaluation of left upper quadrant abdominal pain and lower chest pain.  Patient states that she has had pain in this area for about 2 or 3 months.  She would notice it when she bent over and could make it go away by standing back up.  She thought it was musculoskeletal in nature.  However the over the past 2 days she has had more persistent pain in that area.  She has noted some soft tissue swelling over the left lower ribs.  She has had generalized fatigue and decreased energy that she thought was related to perimenopause.  Otherwise, no chest pain or shortness of breath.  No nausea, vomiting, or diarrhea.  Patient has a cousin who passed away from stomach cancer a couple of years ago so of course she is worried about this.  She has not noticed any appetite changes, weight loss.  No history of anemia, just was told 1 time that her ferritin was low..  Previous laparoscopic surgery but denies other abdominal surgeries.  No treatments prior to arrival.       Prior to Admission medications  Medication Sig Start Date End Date Taking? Authorizing Provider  DULoxetine  (CYMBALTA ) 20 MG capsule TAKE 2 CAPSULES(40 MG) BY MOUTH DAILY 11/04/24   Chandra Toribio POUR, MD  HYDROcodone -acetaminophen  (NORCO/VICODIN) 5-325 MG tablet Take 1 tablet by mouth every 6 (six) hours as needed. 08/13/24   Aberman, Caroline C, PA-C  hydroxychloroquine (PLAQUENIL) 200 MG tablet Take 300 mg by mouth daily. Patient not taking: Reported on 07/18/2024 12/08/16   [provider]  ibuprofen  (ADVIL ) 200 MG tablet Take 200 mg by mouth every 6 (six) hours as needed for fever, headache, mild pain, moderate pain or cramping. 01/27/21   [provider]  melatonin 3 MG TABS  tablet Take 3 mg by mouth at bedtime.    [provider]  methocarbamol  (ROBAXIN ) 500 MG tablet Take 1 tablet (500 mg total) by mouth 2 (two) times daily. 08/13/24   Aberman, Caroline C, PA-C    Allergies: Lisdexamfetamine and Prednisone    Review of Systems  Updated Vital Signs BP 137/73 (BP Location: Right Arm)   Pulse 99   Temp 98.2 F (36.8 C) (Oral)   Resp 17   LMP 12/15/2024   SpO2 99%   Physical Exam Vitals and nursing note reviewed.  Constitutional:      General: She is not in acute distress.    Appearance: She is well-developed.  HENT:     Head: Normocephalic and atraumatic.     Right Ear: External ear normal.     Left Ear: External ear normal.     Nose: Nose normal.  Eyes:     Conjunctiva/sclera: Conjunctivae normal.  Cardiovascular:     Rate and Rhythm: Normal rate and regular rhythm.     Heart sounds: No murmur heard. Pulmonary:     Effort: No respiratory distress.     Breath sounds: No wheezing, rhonchi or rales.  Abdominal:     Palpations: Abdomen is soft.     Tenderness: There is abdominal tenderness in the left upper quadrant. There is no guarding or rebound.      Comments: Patient noted to have some mild  soft tissue fullness without discrete borders overlying the inferior portion of the left ribs.  Musculoskeletal:     Cervical back: Normal range of motion and neck supple.     Right lower leg: No edema.     Left lower leg: No edema.  Skin:    General: Skin is warm and dry.     Findings: No rash.  Neurological:     General: No focal deficit present.     Mental Status: She is alert. Mental status is at baseline.     Motor: No weakness.  Psychiatric:        Mood and Affect: Mood normal.     (all labs ordered are listed, but only abnormal results are displayed) Labs Reviewed  COMPREHENSIVE METABOLIC PANEL WITH GFR - Abnormal; Notable for the following components:      Result Value   Glucose, Bld 107 (*)    All other components within  normal limits  URINALYSIS, ROUTINE W REFLEX MICROSCOPIC - Abnormal; Notable for the following components:   Hgb urine dipstick MODERATE (*)    Leukocytes,Ua SMALL (*)    All other components within normal limits  URINALYSIS, MICROSCOPIC (REFLEX) - Abnormal; Notable for the following components:   Bacteria, UA FEW (*)    All other components within normal limits  LIPASE, BLOOD  CBC  PREGNANCY, URINE    EKG: None  Radiology: CT ABDOMEN PELVIS W CONTRAST Result Date: 12/21/2024 CLINICAL DATA:  Left upper quadrant pain.  Diarrhea. EXAM: CT ABDOMEN AND PELVIS WITH CONTRAST TECHNIQUE: Multidetector CT imaging of the abdomen and pelvis was performed using the standard protocol following bolus administration of intravenous contrast. RADIATION DOSE REDUCTION: This exam was performed according to the departmental dose-optimization program which includes automated exposure control, adjustment of the mA and/or kV according to patient size and/or use of iterative reconstruction technique. CONTRAST:  80mL OMNIPAQUE  IOHEXOL  300 MG/ML  SOLN COMPARISON:  04/16/2018. FINDINGS: Lower chest: Lung bases are clear. Heart size normal. No pericardial or pleural effusion. Distal esophagus is grossly unremarkable. Hepatobiliary: Tiny low-attenuation lesions in the liver, too small to characterize. No specific follow-up necessary. Liver and gallbladder are otherwise unremarkable. No biliary ductal dilatation. Pancreas: Negative. Spleen: Negative. Adrenals/Urinary Tract: Adrenal glands and kidneys are unremarkable. Ureters are decompressed. Bladder is grossly unremarkable. Stomach/Bowel: Stomach, small bowel, appendix and colon are unremarkable. Vascular/Lymphatic: Vascular structures are unremarkable. No pathologically enlarged lymph nodes. Reproductive: Uterus is visualized.  No adnexal mass. Other: Small bilateral inguinal hernias contain fat. No free fluid. Mesenteries and peritoneum are unremarkable. Musculoskeletal:  Minimal degenerative change in the spine. IMPRESSION: No acute findings. Electronically Signed   By: Newell Eke M.D.   On: 12/21/2024 15:16     Procedures   Medications Ordered in the ED - No data to display  ED Course  Patient seen and examined. History obtained directly from patient. Work-up including labs, imaging, EKG ordered in triage, if performed, were reviewed.    Labs/EKG: Independently reviewed and interpreted.  This included: CBC is unremarkable; CMP glucose 107 otherwise unremarkable; lipase normal; UA positive hemoglobin on dipstick but microscopy without significant red cells; pregnancy negative.  Imaging: Discussed imaging versus monitoring and outpatient follow-up with patient.  She would prefer to have imaging performed to rule out other potential etiologies of symptoms.  Do not feel that this is unreasonable given that she has had ongoing left upper quadrant abdominal pain with recent worsening and more persistent nature.  Medications/Fluids: None ordered  Most recent vital  signs reviewed and are as follows: BP 137/73 (BP Location: Right Arm)   Pulse 99   Temp 98.2 F (36.8 C) (Oral)   Resp 17   LMP 12/15/2024   SpO2 99%   Initial impression: Left upper quadrant abdominal pain with soft tissue prominence.   3:30 PM Reassessment performed. Patient appears stable and comfortable.  Imaging personally visualized and interpreted including: CT agree no acute findings in the abdomen which would explain the patient's symptoms, 2 small fat-containing inguinal hernias noted.  Reviewed pertinent lab work and imaging with patient at bedside. Questions answered.  We did discuss incidental finding of inguinal hernias which are currently asymptomatic.  Most current vital signs reviewed and are as follows: BP 137/73 (BP Location: Right Arm)   Pulse 99   Temp 98.2 F (36.8 C) (Oral)   Resp 17   LMP 12/15/2024   SpO2 99%   Plan: Discharge to home.   Prescriptions  written for: None  Other home care instructions discussed: Avoidance of activities which make the symptoms worse, OTC meds for pain control  ED return instructions discussed: The patient was urged to return to the Emergency Department immediately with worsening of current symptoms, worsening abdominal pain, persistent vomiting, blood noted in stools, fever, or any other concerns. The patient verbalized understanding.   Follow-up instructions discussed: Patient encouraged to follow-up with their PCP as needed.                                   Medical Decision Making Amount and/or Complexity of Data Reviewed Labs: ordered. Radiology: ordered.  Risk Prescription drug management.   For this patient's complaint of abdominal pain, the following conditions were considered on the differential diagnosis: gastritis/PUD, enteritis/duodenitis, appendicitis, cholelithiasis/cholecystitis, cholangitis, pancreatitis, ruptured viscus, colitis, diverticulitis, small/large bowel obstruction, proctitis, cystitis, pyelonephritis, ureteral colic, aortic dissection, aortic aneurysm. In women, ectopic pregnancy, pelvic inflammatory disease, ovarian cysts, and tubo-ovarian abscess were also considered. Atypical chest etiologies were also considered including ACS, PE, and pneumonia.  Labs and imaging were reassuring today.  Patient does have asymptomatic bilateral fat-containing inguinal hernias that she was made aware of, but no immediate treatment needed for this, just monitoring.  Encouraged continued use of OTC meds, avoidance of activities which worsen symptoms, follow-up as outpatient if needed.      Final diagnoses:  Left upper quadrant abdominal pain  Non-recurrent bilateral inguinal hernia without obstruction or gangrene    ED Discharge Orders     None          Desiderio Chew, PA-C 12/21/24 1532    Neysa Caron PARAS, DO 12/21/24 1538  "

## 2024-12-21 NOTE — ED Triage Notes (Signed)
 LUQ abd pain, diarrhea, nausea when bending over for multiple months. States 3 days ago pain became more frequent.   Denies urinary symptoms or fevers

## 2024-12-21 NOTE — ED Notes (Signed)
 Pt complaining of LUQ pain and abd swelling. Identifies the pain as feeling behind her ribs, below her L breast. No tenderness with palpation to ribs, but upper L quadrant is tender and + guarding. No recent illness or trauma. Pain went from intermittent to non stop for 3x weeks, figured she should get evaluated now.   Used to feel like a catch or charlie horse and could be triggered when she bent over at the waist, and resolved by standing up straight and stretching. Now nothing makes it beter.

## 2024-12-21 NOTE — Discharge Instructions (Addendum)
 Please read and follow all provided instructions.  Your diagnoses today include:  1. Left upper quadrant abdominal pain   2. Non-recurrent bilateral inguinal hernia without obstruction or gangrene     Tests performed today include: Complete blood cell count: Normal red and white blood cell counts Complete metabolic panel: Normal liver and kidney function Lipase (pancreas function test): Was normal  Urinalysis (urine test): No sign of UTI Pregnancy test (urine or blood, in women only): Negative CT scan of the abdomen and pelvis did not show any concerning findings in the left upper quadrant of the abdomen, you do have small bilateral fat-containing inguinal hernias that are not causing you any symptoms at this time, but please be aware and monitor for signs of symptoms in the future. Vital signs. See below for your results today.   Medications prescribed:  Please use over-the-counter NSAID medications (ibuprofen , naproxen) or Tylenol  (acetaminophen ) as directed on the packaging for pain -- as long as you do not have any reasons avoid these medications. Reasons to avoid NSAID medications include: weak kidneys, a history of bleeding in your stomach or gut, or uncontrolled high blood pressure or previous heart attack. Reasons to avoid Tylenol  include: liver problems or ongoing alcohol use. Never take more than 4000mg  or 8 Extra strength Tylenol  in a 24 hour period.     Take any prescribed medications only as directed.  Home care instructions:  Follow any educational materials contained in this packet.  Follow-up instructions: Please follow-up with your primary care provider as needed for further evaluation of your symptoms.    Return instructions:  SEEK IMMEDIATE MEDICAL ATTENTION IF: The pain does not go away or becomes severe  A temperature above 101F develops  Repeated vomiting occurs (multiple episodes)  The pain becomes localized to portions of the abdomen. The right side could  possibly be appendicitis. In an adult, the left lower portion of the abdomen could be colitis or diverticulitis.  Blood is being passed in stools or vomit (bright red or black tarry stools)  You develop chest pain, difficulty breathing, dizziness or fainting, or become confused, poorly responsive, or inconsolable (young children) If you have any other emergent concerns regarding your health  Additional Information: Abdominal (belly) pain can be caused by many things. Your caregiver performed an examination and possibly ordered blood/urine tests and imaging (CT scan, x-rays, ultrasound). Many cases can be observed and treated at home after initial evaluation in the emergency department. Even though you are being discharged home, abdominal pain can be unpredictable. Therefore, you need a repeated exam if your pain does not resolve, returns, or worsens. Most patients with abdominal pain don't have to be admitted to the hospital or have surgery, but serious problems like appendicitis and gallbladder attacks can start out as nonspecific pain. Many abdominal conditions cannot be diagnosed in one visit, so follow-up evaluations are very important.  Your vital signs today were: BP 137/73 (BP Location: Right Arm)   Pulse 99   Temp 98.2 F (36.8 C) (Oral)   Resp 17   LMP 12/15/2024   SpO2 99%  If your blood pressure (bp) was elevated above 135/85 this visit, please have this repeated by your doctor within one month. --------------

## 2024-12-22 ENCOUNTER — Encounter: Payer: Self-pay | Admitting: *Deleted

## 2024-12-23 ENCOUNTER — Telehealth: Payer: Self-pay | Admitting: *Deleted

## 2024-12-23 NOTE — Telephone Encounter (Signed)
 Orders discontinued.   Routing to [employer].   Encounter closed.

## 2024-12-23 NOTE — Telephone Encounter (Signed)
 Routing to Dr. Karma Greaser to review and advise.

## 2024-12-23 NOTE — Telephone Encounter (Signed)
-----   Message from Robbins S sent at 12/23/2024  2:23 PM EST ----- Dr Glennon ordered US  and endo bx on August 2025 patient cancelled her appointment. I spoke with her today and she does not want to schedule at this time she has no insurance/ will call back once she has insurance to reschedule
